# Patient Record
Sex: Female | Born: 1970
Health system: Southern US, Community
[De-identification: ages and names within clinical notes are randomized; demographics above are authoritative.]

## PROBLEM LIST (undated history)

## (undated) ENCOUNTER — Inpatient Hospital Stay: Payer: Self-pay

## (undated) DIAGNOSIS — R0600 Dyspnea, unspecified: Secondary | ICD-10-CM

## (undated) DIAGNOSIS — Z862 Personal history of diseases of the blood and blood-forming organs and certain disorders involving the immune mechanism: Secondary | ICD-10-CM

## (undated) DIAGNOSIS — R87629 Unspecified abnormal cytological findings in specimens from vagina: Secondary | ICD-10-CM

## (undated) DIAGNOSIS — O09299 Supervision of pregnancy with other poor reproductive or obstetric history, unspecified trimester: Secondary | ICD-10-CM

## (undated) DIAGNOSIS — R002 Palpitations: Secondary | ICD-10-CM

## (undated) DIAGNOSIS — M1990 Unspecified osteoarthritis, unspecified site: Secondary | ICD-10-CM

## (undated) HISTORY — DX: Supervision of pregnancy with other poor reproductive or obstetric history, unspecified trimester: O09.299

## (undated) HISTORY — PX: TONSILLECTOMY: SUR1361

## (undated) HISTORY — PX: WISDOM TOOTH EXTRACTION: SHX21

## (undated) HISTORY — DX: Palpitations: R00.2

## (undated) HISTORY — DX: Unspecified abnormal cytological findings in specimens from vagina: R87.629

## (undated) HISTORY — PX: LEEP: SHX91

---

## 2006-11-14 HISTORY — PX: CERVICAL CONE BIOPSY: SUR198

## 2011-05-30 ENCOUNTER — Encounter: Payer: Self-pay | Admitting: Unknown Physician Specialty

## 2011-06-15 ENCOUNTER — Encounter: Payer: Self-pay | Admitting: Unknown Physician Specialty

## 2015-11-15 NOTE — L&D Delivery Note (Signed)
Delivery Summary for Erin Gomez  Labor Events:   Preterm labor:   Rupture date:   Rupture time:   Rupture type: Spontaneous  Fluid Color: Clear  Induction:   Augmentation:   Complications:   Cervical ripening:          Delivery:   Episiotomy:   Lacerations:   Repair suture:   Repair # of packets:   Blood loss (ml): 400   Information for the patient's newborn:  [name], [name] [[id number]][[id number]]    Delivery 10/14/2016 11:41 AM by  Vaginal, Spontaneous Delivery Sex:  female Gestational Age: 4759w4d Delivery Clinician:   Living?:         APGARS  One minute Five minutes Ten minutes  Skin color:        Heart rate:        Grimace:        Muscle tone:        Breathing:        Totals: 8  9      Presentation/position:      Resuscitation:   Cord information:    Disposition of cord blood:     Blood gases sent?  Complications:   Placenta: Delivered:       appearance Newborn Measurements: Weight: 8 lb 12 oz (3970 g)  Height: 20.47"  Head circumference:    Chest circumference:    Other providers:    Additional  information: Forceps:   Vacuum:   Breech:   Observed anomalies       Delivery Note At 11:41 AM a viable and healthy female was delivered via Vaginal, Spontaneous Delivery (Presentation: Compound Vertex (with hand); LOA position).  APGAR: 8, 9; weight 8 lb 12 oz (3970 g).   Placenta status: spontaneously removed, intact.  Cord: 3-vessel, with the following complications: None.  Cord pH: not obtained.  Anesthesia: None Episiotomy:  None Lacerations:  Left vaginal, hemostatic Suture Repair: None Est. Blood Loss (mL):  400   Mom to postpartum.  Baby to Couplet care / Skin to Skin.  Hildred Lasernika Hector Taft 10/14/2016, 6:09 PM

## 2016-02-29 ENCOUNTER — Encounter: Payer: Self-pay | Admitting: Obstetrics and Gynecology

## 2016-02-29 ENCOUNTER — Ambulatory Visit: Payer: BLUE CROSS/BLUE SHIELD

## 2016-02-29 VITALS — BP 116/58 | HR 72 | Wt 140.7 lb

## 2016-02-29 DIAGNOSIS — N926 Irregular menstruation, unspecified: Secondary | ICD-10-CM

## 2016-03-01 ENCOUNTER — Other Ambulatory Visit: Payer: Self-pay | Admitting: Obstetrics and Gynecology

## 2016-03-01 DIAGNOSIS — O360190 Maternal care for anti-D [Rh] antibodies, unspecified trimester, not applicable or unspecified: Secondary | ICD-10-CM

## 2016-03-01 DIAGNOSIS — Z6791 Unspecified blood type, Rh negative: Secondary | ICD-10-CM | POA: Insufficient documentation

## 2016-03-01 DIAGNOSIS — O26899 Other specified pregnancy related conditions, unspecified trimester: Secondary | ICD-10-CM | POA: Insufficient documentation

## 2016-03-01 LAB — CBC WITH DIFFERENTIAL/PLATELET
BASOS: 0 %
Basophils Absolute: 0 10*3/uL (ref 0.0–0.2)
EOS (ABSOLUTE): 0.1 10*3/uL (ref 0.0–0.4)
EOS: 1 %
Hematocrit: 38.5 % (ref 34.0–46.6)
Hemoglobin: 12.8 g/dL (ref 11.1–15.9)
IMMATURE GRANS (ABS): 0 10*3/uL (ref 0.0–0.1)
IMMATURE GRANULOCYTES: 0 %
LYMPHS: 14 %
Lymphocytes Absolute: 1.1 10*3/uL (ref 0.7–3.1)
MCH: 30.1 pg (ref 26.6–33.0)
MCHC: 33.2 g/dL (ref 31.5–35.7)
MCV: 91 fL (ref 79–97)
MONOS ABS: 0.4 10*3/uL (ref 0.1–0.9)
Monocytes: 5 %
NEUTROS PCT: 80 %
Neutrophils Absolute: 6.3 10*3/uL (ref 1.4–7.0)
PLATELETS: 211 10*3/uL (ref 150–379)
RBC: 4.25 x10E6/uL (ref 3.77–5.28)
RDW: 12.8 % (ref 12.3–15.4)
WBC: 7.9 10*3/uL (ref 3.4–10.8)

## 2016-03-01 LAB — URINALYSIS, ROUTINE W REFLEX MICROSCOPIC
Bilirubin, UA: NEGATIVE
Glucose, UA: NEGATIVE
Ketones, UA: NEGATIVE
Leukocytes, UA: NEGATIVE
NITRITE UA: NEGATIVE
Protein, UA: NEGATIVE
RBC, UA: NEGATIVE
SPEC GRAV UA: 1.007 (ref 1.005–1.030)
UUROB: 0.2 mg/dL (ref 0.2–1.0)
pH, UA: 7 (ref 5.0–7.5)

## 2016-03-01 LAB — GC/CHLAMYDIA PROBE AMP
Chlamydia trachomatis, NAA: NEGATIVE
NEISSERIA GONORRHOEAE BY PCR: NEGATIVE

## 2016-03-01 LAB — VARICELLA ZOSTER ANTIBODY, IGG: VARICELLA: 1119 {index} (ref >165)

## 2016-03-01 LAB — RUBELLA SCREEN: Rubella Antibodies, IGG: 2.73 index (ref >0.99)

## 2016-03-01 LAB — URINE CULTURE: ORGANISM ID, BACTERIA: NO GROWTH

## 2016-03-01 LAB — ABO AND RH: RH TYPE: NEGATIVE

## 2016-03-01 LAB — HEP, RPR, HIV PANEL
HEP B S AG: NEGATIVE
HIV Screen 4th Generation wRfx: NONREACTIVE
RPR Ser Ql: NONREACTIVE

## 2016-03-01 LAB — BETA HCG QUANT (REF LAB): hCG Quant: 142610 m[IU]/mL

## 2016-03-01 LAB — ANTIBODY SCREEN: Antibody Screen: NEGATIVE

## 2016-03-02 ENCOUNTER — Telehealth: Payer: Self-pay | Admitting: *Deleted

## 2016-03-02 NOTE — Telephone Encounter (Signed)
Patient called and stated that she was prescribed Zofran from another provider for nausea,.That provider  prescribed 8 mg every 8hrs. . The pharmacy stated that 8 mg was too much and that  it was ok to cut the dosage in 1/2 to 4 mg every 8hrs. Pt did that and noticed that it is wearing off sooner about 6hrs. Pt is wondering if she can take the other 4mg  even if it has not been 8hrs yet.  Patient is requesting a call back [phone]

## 2016-03-03 NOTE — Telephone Encounter (Signed)
I spoke with MNS and she did confirm that pt could have Zofran 8mg  every 6hrs if needed. When I spoke with pt she said the 8mg  made her woozy, and the 4mg  was only lasting about 6 hrs. Pt informed she could take the 4mg  every 6h as needed.

## 2016-03-04 ENCOUNTER — Other Ambulatory Visit: Payer: BLUE CROSS/BLUE SHIELD

## 2016-03-04 ENCOUNTER — Ambulatory Visit (INDEPENDENT_AMBULATORY_CARE_PROVIDER_SITE_OTHER): Payer: BLUE CROSS/BLUE SHIELD

## 2016-03-04 ENCOUNTER — Telehealth: Payer: Self-pay | Admitting: *Deleted

## 2016-03-04 DIAGNOSIS — N926 Irregular menstruation, unspecified: Secondary | ICD-10-CM | POA: Diagnosis not present

## 2016-03-04 DIAGNOSIS — Z8759 Personal history of other complications of pregnancy, childbirth and the puerperium: Secondary | ICD-10-CM

## 2016-03-04 LAB — PROGESTERONE: PROGESTERONE: 31.2 ng/mL

## 2016-03-04 NOTE — Telephone Encounter (Signed)
-----   Message from Purcell NailsMelody N Shambley, [place] sent at 03/04/2016  2:09 PM EDT ----- Please let her know progesterone levels are good!

## 2016-03-04 NOTE — Telephone Encounter (Signed)
Left pt detailed message about labs 

## 2016-03-14 NOTE — Progress Notes (Unsigned)
NO[facility] nurse intake discussed all info in packet with pt, she has strong history of miscarriages, will follow closely, patient and husband voiced understanding

## 2016-03-21 ENCOUNTER — Encounter: Payer: Self-pay | Admitting: Obstetrics and Gynecology

## 2016-03-23 ENCOUNTER — Ambulatory Visit (INDEPENDENT_AMBULATORY_CARE_PROVIDER_SITE_OTHER): Payer: BLUE CROSS/BLUE SHIELD | Admitting: Obstetrics and Gynecology

## 2016-03-23 ENCOUNTER — Encounter: Payer: Self-pay | Admitting: Obstetrics and Gynecology

## 2016-03-23 VITALS — BP 111/66 | HR 78 | Ht 64.0 in | Wt 140.9 lb

## 2016-03-23 DIAGNOSIS — Z331 Pregnant state, incidental: Secondary | ICD-10-CM

## 2016-03-23 LAB — POCT URINALYSIS DIPSTICK
Bilirubin, UA: NEGATIVE
GLUCOSE UA: NEGATIVE
KETONES UA: NEGATIVE
Leukocytes, UA: NEGATIVE
Nitrite, UA: NEGATIVE
Protein, UA: NEGATIVE
RBC UA: NEGATIVE
UROBILINOGEN UA: 0.2
pH, UA: 6

## 2016-03-23 NOTE — Patient Instructions (Signed)
Rh Incompatibility Rh incompatibility is a condition that occurs during pregnancy if a woman has Rh-negative blood and her baby has Rh-positive blood. "Rh-negative" and "Rh-positive" refer to whether or not the blood has an Rh factor. An Rh factor is a specific protein found on the surface of red blood cells. If a woman has Rh factor, she is Rh-positive. If she does not have an Rh factor, she is Rh-negative. Having or not having an Rh factor does not affect the mother's general health. However, it can cause problems during pregnancy.  WHAT KIND OF PROBLEMS CAN Rh INCOMPATIBILITY CAUSE? During pregnancy, blood from the baby can cross into the mother's bloodstream, especially during delivery. If a mother is Rh-negative and the baby is Rh-positive, the mother's defense system will react to the baby's blood as if it was a foreign substance and will create proteins (antibodies). This is called sensitization. Once the mother is sensitized, her Rh antibodies will cross the placenta to the baby and attack the baby's Rh-positive blood as if it is a harmful substance.  Rh incompatibility can also happen if the Rh-negative pregnant woman is exposed to the Rh factor during a blood transfusion with Rh-positive blood.  HOW DOES THIS CONDITION AFFECT MY BABY? The Rh antibodies that attack and destroy the baby's red blood cells can lead to hemolytic disease in the baby. Hemolytic disease is when the red blood cells break down. This can cause:   Yellowing of the skin and eyes (jaundice).  The body to not have enough healthy red blood cells (anemia).   Brain damage.   Heart failure.   Death.  These antibodies usually do not cause problems during a first pregnancy. This is because the blood from the baby often times crosses into the mother's bloodstream during delivery, and the baby is born before many of the antibodies can develop. However, the antibodies stay in your body once they have formed. Because of this,  Rh incompatibility is more likely to cause problems in second or later pregnancies (if the baby is Rh-positive).  HOW IS THIS CONDITION DIAGNOSED? When a woman becomes pregnant, blood tests may be done to find out her blood type and Rh factor. If the woman is Rh-negative, she also may have another blood test called an antibody screen. The antibody screen shows whether she has Rh antibodies in her blood. If she does, it means she was exposed to Rh-positive blood before, and she is at risk for Rh incompatibility.  To find out whether the baby is developing hemolytic anemia and how serious it is, caregivers may use more advanced tests, such as ultrasonography (commonly known as ultrasound).  HOW IS Rh INCOMPATIBILITY TREATED?  Rh incompatibility is treated with a shot of medicine called Rho (D) immune globulin. This medicine keeps the woman's body from making antibodies that can cause serious problems in the baby or future babies.  Two shots will be given, one at around your seventh month of pregnancy and the other within 72 hours of your baby being born. If you are Rh-negative, you will need this medicine every time you have a baby with Rh-positive blood. If you already have antibodies in your blood, Rho (D) immune globulin will not help. Your doctor will not give you this medicine, but will watch your pregnancy closely for problems instead.  This shot may also be given to an Rh-negative woman when the risk of blood transfer between the mom and baby is high. The risk is high with:     An amniocentesis.   A miscarriage or an abortion.   An ectopic pregnancy.   Any vaginal bleeding during pregnancy.    This information is not intended to replace advice given to you by your health care provider. Make sure you discuss any questions you have with your health care provider.   Document Released: 04/22/2002 Document Revised: 11/05/2013 Document Reviewed: 02/12/2013 Elsevier Interactive Patient  Education 2016 Elsevier Inc.  

## 2016-03-23 NOTE — Progress Notes (Signed)
ROB- pt is worried, wants to have some reassurance, taking her zofran

## 2016-03-23 NOTE — Progress Notes (Signed)
  NEW OB HISTORY AND PHYSICAL  SUBJECTIVE:       Erin Gomez is a 45 y.o. [id number] female, Patient's last menstrual period was 01/04/2016., Estimated Date of Delivery: 10/10/16, 2576w2d, presents today for establishment of Prenatal Care. She has no unusual complaints and complains of nausea and vomiting relieved with zofran      Gynecologic History Patient's last menstrual period was 01/04/2016. Normal Contraception: none Last Pap: 2017. Results were: normal  Obstetric History OB History  Gravida Para Term Preterm AB SAB TAB Ectopic Multiple Living  9    8 8         # Outcome Date GA Lbr Len/2nd Weight Sex Delivery Anes PTL Lv  9 Current           8 SAB           7 SAB           6 SAB           5 SAB           4 SAB           3 SAB           2 SAB           1 SAB               Past Medical History  Diagnosis Date  . History of miscarriage, currently pregnant   . Vaginal Pap smear, abnormal     Past Surgical History  Procedure Laterality Date  . Leep      Current Outpatient Prescriptions on File Prior to Visit  Medication Sig Dispense Refill  . Prenatal Vit-Fe Fumarate-FA (PRENATAL MULTIVITAMIN) TABS tablet Take 1 tablet by mouth daily at 12 noon.     No current facility-administered medications on file prior to visit.    No Known Allergies  Social History   Social History  . Marital Status: Married    Spouse Name: N/A  . Number of Children: N/A  . Years of Education: N/A   Occupational History  . Not on file.   Social History Main Topics  . Smoking status: Never Smoker   . Smokeless tobacco: Never Used  . Alcohol Use: No  . Drug Use: No  . Sexual Activity: Yes   Other Topics Concern  . Not on file   Social History Narrative    History reviewed. No pertinent family history.  The following portions of the patient's history were reviewed and updated as appropriate: allergies, current medications, past OB history, past medical history,  past surgical history, past family history, past social history, and problem list.    OBJECTIVE: Initial Physical Exam (New OB)  GENERAL APPEARANCE: alert, well appearing, in no apparent distress, oriented to person, place and time, anxious HEAD: normocephalic, atraumatic MOUTH: mucous membranes moist, pharynx normal without lesions and dental hygiene good THYROID: no thyromegaly or masses present BREASTS: not examined LUNGS: clear to auscultation, no wheezes, rales or rhonchi, symmetric air entry HEART: regular rate and rhythm, no murmurs ABDOMEN: soft, nontender, nondistended, no abnormal masses, no epigastric pain, fundus not palpable and FHT present EXTREMITIES: no edema SKIN: normal coloration and turgor, no rashes LYMPH NODES: no adenopathy palpable NEUROLOGIC: alert, oriented, normal speech, no focal findings or movement disorder noted  PELVIC EXAM not indicated  ASSESSMENT: Normal pregnancy AMA Recurrent early miscarriages  PLAN: Prenatal care-routine Reviewed negative genetic screening See orders

## 2016-04-01 ENCOUNTER — Other Ambulatory Visit: Payer: Self-pay | Admitting: *Deleted

## 2016-04-01 ENCOUNTER — Telehealth: Payer: Self-pay | Admitting: Obstetrics and Gynecology

## 2016-04-01 MED ORDER — ACYCLOVIR 400 MG PO TABS: 400.0000 mg | ORAL_TABLET | Freq: Two times a day (BID) | ORAL | Status: DC

## 2016-04-01 NOTE — Telephone Encounter (Signed)
Done-ac 

## 2016-04-01 NOTE — Telephone Encounter (Signed)
PT WANTS TO ASK YOU ABOUT A MEDICATION SHE USED TO TAKE AND MAY WANT TO TAKE IT AGAIN.

## 2016-04-20 ENCOUNTER — Ambulatory Visit (INDEPENDENT_AMBULATORY_CARE_PROVIDER_SITE_OTHER): Payer: BLUE CROSS/BLUE SHIELD | Admitting: Obstetrics and Gynecology

## 2016-04-20 ENCOUNTER — Encounter: Payer: Self-pay | Admitting: Obstetrics and Gynecology

## 2016-04-20 VITALS — BP 105/62 | HR 80 | Wt 143.6 lb

## 2016-04-20 DIAGNOSIS — Z369 Encounter for antenatal screening, unspecified: Secondary | ICD-10-CM

## 2016-04-20 DIAGNOSIS — O09519 Supervision of elderly primigravida, unspecified trimester: Secondary | ICD-10-CM | POA: Insufficient documentation

## 2016-04-20 DIAGNOSIS — Z1389 Encounter for screening for other disorder: Secondary | ICD-10-CM

## 2016-04-20 DIAGNOSIS — A609 Anogenital herpesviral infection, unspecified: Secondary | ICD-10-CM

## 2016-04-20 DIAGNOSIS — Z36 Encounter for antenatal screening of mother: Secondary | ICD-10-CM

## 2016-04-20 DIAGNOSIS — A6009 Herpesviral infection of other urogenital tract: Secondary | ICD-10-CM | POA: Insufficient documentation

## 2016-04-20 LAB — POCT URINALYSIS DIPSTICK
Bilirubin, UA: NEGATIVE
Blood, UA: NEGATIVE
GLUCOSE UA: NEGATIVE
Ketones, UA: NEGATIVE
Leukocytes, UA: NEGATIVE
NITRITE UA: NEGATIVE
PH UA: 8
PROTEIN UA: NEGATIVE
SPEC GRAV UA: 1.01
UROBILINOGEN UA: NEGATIVE

## 2016-04-20 NOTE — Progress Notes (Signed)
Rob-DOING BETTER, ONLY OCCASIONAL NAUSEA.had herpes outbreak last week, took meds and it went away- will start prophylaxis at 34 weeks.

## 2016-05-13 ENCOUNTER — Telehealth: Payer: Self-pay | Admitting: *Deleted

## 2016-05-13 NOTE — Telephone Encounter (Signed)
Nothing to help sore throat but losenges and cloraseptic spray (all OTC and safe in pregnancy), and if she thinks she is having a sinus infection, she will need to be examined- recommend [facility] in leu of holiday weekend

## 2016-05-13 NOTE — Telephone Encounter (Signed)
Patient called and states she has to sing tonight was wondering if the Melody can prescribe her something to take for a sore throat. She is also experiencing sinus infection symptoms and a cough. Pt [facility] is [facility] [place]. Thanks

## 2016-05-13 NOTE — Telephone Encounter (Signed)
pls advise

## 2016-05-16 ENCOUNTER — Telehealth: Payer: Self-pay | Admitting: Obstetrics and Gynecology

## 2016-05-16 ENCOUNTER — Telehealth: Payer: Self-pay | Admitting: Family

## 2016-05-16 DIAGNOSIS — B9689 Other specified bacterial agents as the cause of diseases classified elsewhere: Principal | ICD-10-CM

## 2016-05-16 DIAGNOSIS — J988 Other specified respiratory disorders: Secondary | ICD-10-CM

## 2016-05-16 MED ORDER — AZITHROMYCIN 250 MG PO TABS: ORAL_TABLET | ORAL | Status: DC

## 2016-05-16 NOTE — Progress Notes (Signed)
We are sorry that you are not feeling well.  Here is how we plan to help!  Based on what you have shared with me it looks like you have upper respiratory tract inflammation that has resulted in a significant cough.  Inflammation and infection in the upper respiratory tract is commonly called bronchitis and has four common causes:  Allergies, Viral Infections, Acid Reflux and Bacterial Infections.  Allergies, viruses and acid reflux are treated by controlling symptoms or eliminating the cause. An example might be a cough caused by taking certain blood pressure medications. You stop the cough by changing the medication. Another example might be a cough caused by acid reflux. Controlling the reflux helps control the cough.  Based on your presentation I believe you most likely have A cough due to bacteria.  When patients have a fever and a productive cough with a change in color or increased sputum production, we are concerned about bacterial bronchitis.  If left untreated it can progress to pneumonia.  If your symptoms do not improve with your treatment plan it is important that you contact your provider.   I have prescribed Azithromyin 250 mg: two tables now and then one tablet daily for 4 additonal days   In addition you may use Mucinex but NOT the DM version.    HOME CARE . Only take medications as instructed by your medical team. . Complete the entire course of an antibiotic. . Drink plenty of fluids and get plenty of rest. . Avoid close contacts especially the very young and the elderly . Cover your mouth if you cough or cough into your sleeve. . Always remember to wash your hands . A steam or ultrasonic humidifier can help congestion.    GET HELP RIGHT AWAY IF: . You develop worsening fever. . You become short of breath . You cough up blood. . Your symptoms persist after you have completed your treatment plan MAKE SURE YOU   Understand these instructions.  Will watch your  condition.  Will get help right away if you are not doing well or get worse.  Your e-visit answers were reviewed by a board certified advanced clinical practitioner to complete your personal care plan.  Depending on the condition, your plan could have included both over the counter or prescription medications. If there is a problem please reply  once you have received a response from your provider. Your safety is important to us.  If you have drug allergies check your prescription carefully.    You can use MyChart to ask questions about today's visit, request a non-urgent call back, or ask for a work or school excuse for 24 hours related to this e-Visit. If it has been greater than 24 hours you will need to follow up with your provider, or enter a new e-Visit to address those concerns. You will get an e-mail in the next two days asking about your experience.  I hope that your e-visit has been valuable and will speed your recovery. Thank you for using e-visits.

## 2016-05-16 NOTE — Telephone Encounter (Signed)
[redacted] WKS PREGNANT// SINUS INF, EAR ACHE  THROAT SORE, NO FEVER./ MUCUS GREEN., COUGH THRU THE NITE  PT WANTS TO KNOW IF SHE NEEDS AN ANTIBIOTIC ([facility]

## 2016-05-16 NOTE — Telephone Encounter (Signed)
Left detailed message needs to be seen

## 2016-05-18 ENCOUNTER — Encounter: Payer: Self-pay | Admitting: Obstetrics and Gynecology

## 2016-05-18 ENCOUNTER — Ambulatory Visit (INDEPENDENT_AMBULATORY_CARE_PROVIDER_SITE_OTHER): Payer: BLUE CROSS/BLUE SHIELD

## 2016-05-18 ENCOUNTER — Ambulatory Visit (INDEPENDENT_AMBULATORY_CARE_PROVIDER_SITE_OTHER): Payer: BLUE CROSS/BLUE SHIELD | Admitting: Obstetrics and Gynecology

## 2016-05-18 VITALS — BP 120/72 | HR 88 | Wt 143.0 lb

## 2016-05-18 DIAGNOSIS — J988 Other specified respiratory disorders: Secondary | ICD-10-CM

## 2016-05-18 DIAGNOSIS — Z3492 Encounter for supervision of normal pregnancy, unspecified, second trimester: Secondary | ICD-10-CM

## 2016-05-18 DIAGNOSIS — B9689 Other specified bacterial agents as the cause of diseases classified elsewhere: Secondary | ICD-10-CM

## 2016-05-18 DIAGNOSIS — Z369 Encounter for antenatal screening, unspecified: Secondary | ICD-10-CM

## 2016-05-18 DIAGNOSIS — Z36 Encounter for antenatal screening of mother: Secondary | ICD-10-CM

## 2016-05-18 DIAGNOSIS — J069 Acute upper respiratory infection, unspecified: Secondary | ICD-10-CM

## 2016-05-18 LAB — POCT URINALYSIS DIPSTICK
Bilirubin, UA: NEGATIVE
Blood, UA: NEGATIVE
Glucose, UA: NEGATIVE
KETONES UA: NEGATIVE
LEUKOCYTES UA: NEGATIVE
Nitrite, UA: NEGATIVE
PH UA: 6
PROTEIN UA: NEGATIVE
Spec Grav, UA: 1.01
Urobilinogen, UA: 0.2

## 2016-05-18 MED ORDER — AZITHROMYCIN 250 MG PO TABS: ORAL_TABLET | ORAL | Status: DC

## 2016-05-18 MED ORDER — CEFDINIR 300 MG PO CAPS: 300.0000 mg | ORAL_CAPSULE | Freq: Two times a day (BID) | ORAL | Status: DC

## 2016-05-18 MED ORDER — FOLIC ACID 1 MG PO TABS: 1.0000 mg | ORAL_TABLET | Freq: Every day | ORAL | Status: DC

## 2016-05-18 NOTE — Progress Notes (Signed)
ROB- pt had anatomy scan today- doesn't want to know the sex of the baby, she has sinus inf- was given zpak- doesn't feel like its working, had pain B ears

## 2016-05-18 NOTE — Progress Notes (Signed)
ROB- anatomy scan reviewed: Indications:Anatomy U/S Findings:  Singleton intrauterine pregnancy is visualized with FHR at 153 BPM. Biometrics give an (U/S) Gestational age of 19 2/7 weeks and an (U/S) EDD of 10/10/16; this correlates with the clinically established EDD of 10/10/16.  Fetal presentation is Breech.  EFW: 283g (10oz). Placenta: Anterior, grade 0, 4.3cm from internal os. AFI: Adequate with MVP of 3.4cm.  Anatomic survey is incomplete and normal; Gender - female  .  Spine is suboptimally visualized due to fetal position.  Ovaries are not seen. Survey of the adnexa demonstrates no adnexal masses. There is no free peritoneal fluid in the cul de sac.  Impression: 1. 19 2/7 week Viable Singleton Intrauterine pregnancy by U/S. 2. (U/S) EDD is consistent with Clinically established (LMP) EDD of 10/10/16. 3. Spine is suboptimally visualized due to fetal position.  URI not improving with prescribed Zpak- switched to omnecef 300mg  bid; to add sudafed prn and restart acyclovir daily.

## 2016-06-10 ENCOUNTER — Other Ambulatory Visit: Payer: Self-pay | Admitting: Obstetrics and Gynecology

## 2016-06-10 DIAGNOSIS — Z0489 Encounter for examination and observation for other specified reasons: Secondary | ICD-10-CM

## 2016-06-10 DIAGNOSIS — IMO0002 Reserved for concepts with insufficient information to code with codable children: Secondary | ICD-10-CM

## 2016-06-15 ENCOUNTER — Ambulatory Visit (INDEPENDENT_AMBULATORY_CARE_PROVIDER_SITE_OTHER): Payer: BLUE CROSS/BLUE SHIELD

## 2016-06-15 ENCOUNTER — Ambulatory Visit (INDEPENDENT_AMBULATORY_CARE_PROVIDER_SITE_OTHER): Payer: BLUE CROSS/BLUE SHIELD | Admitting: Obstetrics and Gynecology

## 2016-06-15 VITALS — BP 111/77 | HR 71 | Wt 148.5 lb

## 2016-06-15 DIAGNOSIS — Z36 Encounter for antenatal screening of mother: Secondary | ICD-10-CM

## 2016-06-15 DIAGNOSIS — Z3492 Encounter for supervision of normal pregnancy, unspecified, second trimester: Secondary | ICD-10-CM

## 2016-06-15 DIAGNOSIS — Z0489 Encounter for examination and observation for other specified reasons: Secondary | ICD-10-CM

## 2016-06-15 DIAGNOSIS — IMO0002 Reserved for concepts with insufficient information to code with codable children: Secondary | ICD-10-CM

## 2016-06-15 LAB — POCT URINALYSIS DIPSTICK
Bilirubin, UA: NEGATIVE
Blood, UA: NEGATIVE
Glucose, UA: NEGATIVE
Ketones, UA: NEGATIVE
LEUKOCYTES UA: NEGATIVE
Nitrite, UA: NEGATIVE
PROTEIN UA: NEGATIVE
Spec Grav, UA: 1.01
Urobilinogen, UA: 0.2
pH, UA: 6.5

## 2016-06-15 NOTE — Progress Notes (Signed)
ROB- pt c/o pain under her rib cage, thinks she may have a hernia," otherwise she is bouncing around"

## 2016-06-15 NOTE — Progress Notes (Signed)
ROB-needs refill on zofran, doing well glucola next visit.

## 2016-07-05 ENCOUNTER — Other Ambulatory Visit: Payer: Self-pay | Admitting: *Deleted

## 2016-07-05 ENCOUNTER — Telehealth: Payer: Self-pay | Admitting: *Deleted

## 2016-07-05 MED ORDER — ONDANSETRON HCL 8 MG PO TABS: 8.0000 mg | ORAL_TABLET | Freq: Three times a day (TID) | ORAL | 3 refills | Status: DC | PRN

## 2016-07-05 NOTE — Telephone Encounter (Signed)
zofran was not at phrmacy and please ask for oval ones...  She is going to have Rhogam next visit and plans to travel. Will she have any symptoms from this inj

## 2016-07-05 NOTE — Telephone Encounter (Signed)
Sent medication in and advised of traveling

## 2016-07-11 ENCOUNTER — Other Ambulatory Visit: Payer: Self-pay | Admitting: *Deleted

## 2016-07-11 DIAGNOSIS — Z131 Encounter for screening for diabetes mellitus: Secondary | ICD-10-CM

## 2016-07-12 ENCOUNTER — Emergency Department: Payer: BLUE CROSS/BLUE SHIELD

## 2016-07-12 ENCOUNTER — Encounter: Payer: Self-pay | Admitting: Emergency Medicine

## 2016-07-12 ENCOUNTER — Emergency Department
Admission: EM | Admit: 2016-07-12 | Discharge: 2016-07-12 | Disposition: A | Discharge status: Home / Self Care | Payer: BLUE CROSS/BLUE SHIELD | Attending: Student | Admitting: Student

## 2016-07-12 DIAGNOSIS — Z5181 Encounter for therapeutic drug level monitoring: Secondary | ICD-10-CM | POA: Diagnosis not present

## 2016-07-12 DIAGNOSIS — G43809 Other migraine, not intractable, without status migrainosus: Secondary | ICD-10-CM | POA: Insufficient documentation

## 2016-07-12 DIAGNOSIS — O99352 Diseases of the nervous system complicating pregnancy, second trimester: Secondary | ICD-10-CM | POA: Diagnosis not present

## 2016-07-12 DIAGNOSIS — G43109 Migraine with aura, not intractable, without status migrainosus: Secondary | ICD-10-CM

## 2016-07-12 DIAGNOSIS — G43909 Migraine, unspecified, not intractable, without status migrainosus: Secondary | ICD-10-CM | POA: Diagnosis not present

## 2016-07-12 DIAGNOSIS — Z3A27 27 weeks gestation of pregnancy: Secondary | ICD-10-CM | POA: Diagnosis not present

## 2016-07-12 LAB — URINALYSIS COMPLETE WITH MICROSCOPIC (ARMC ONLY)
BILIRUBIN URINE: NEGATIVE
Glucose, UA: NEGATIVE mg/dL
HGB URINE DIPSTICK: NEGATIVE
KETONES UR: NEGATIVE mg/dL
LEUKOCYTES UA: NEGATIVE
NITRITE: NEGATIVE
PH: 7 (ref 5.0–8.0)
Protein, ur: NEGATIVE mg/dL
SPECIFIC GRAVITY, URINE: 1.003 — AB (ref 1.005–1.030)

## 2016-07-12 LAB — COMPREHENSIVE METABOLIC PANEL
ALBUMIN: 3.5 g/dL (ref 3.5–5.0)
ALT: 25 U/L (ref 14–54)
ANION GAP: 10 (ref 5–15)
AST: 23 U/L (ref 15–41)
Alkaline Phosphatase: 46 U/L (ref 38–126)
BUN: 7 mg/dL (ref 6–20)
CHLORIDE: 106 mmol/L (ref 101–111)
CO2: 19 mmol/L — AB (ref 22–32)
Calcium: 8.8 mg/dL — ABNORMAL LOW (ref 8.9–10.3)
Creatinine, Ser: 0.43 mg/dL — ABNORMAL LOW (ref 0.44–1.00)
GFR calc non Af Amer: >60 mL/min (ref >60)
GLUCOSE: 122 mg/dL — AB (ref 65–99)
Potassium: 3.7 mmol/L (ref 3.5–5.1)
SODIUM: 135 mmol/L (ref 135–145)
Total Bilirubin: 0.3 mg/dL (ref 0.3–1.2)
Total Protein: 6.4 g/dL — ABNORMAL LOW (ref 6.5–8.1)

## 2016-07-12 LAB — CBC WITH DIFFERENTIAL/PLATELET
BASOS PCT: 0 %
Basophils Absolute: 0 10*3/uL (ref 0–0.1)
EOS ABS: 0.1 10*3/uL (ref 0–0.7)
EOS PCT: 1 %
HCT: 35.4 % (ref 35.0–47.0)
Hemoglobin: 12.5 g/dL (ref 12.0–16.0)
LYMPHS ABS: 1 10*3/uL (ref 1.0–3.6)
Lymphocytes Relative: 13 %
MCH: 31 pg (ref 26.0–34.0)
MCHC: 35.4 g/dL (ref 32.0–36.0)
MCV: 87.7 fL (ref 80.0–100.0)
Monocytes Absolute: 0.4 10*3/uL (ref 0.2–0.9)
Monocytes Relative: 6 %
NEUTROS PCT: 80 %
Neutro Abs: 6.5 10*3/uL (ref 1.4–6.5)
PLATELETS: 170 10*3/uL (ref 150–440)
RBC: 4.04 MIL/uL (ref 3.80–5.20)
RDW: 13.2 % (ref 11.5–14.5)
WBC: 8.1 10*3/uL (ref 3.6–11.0)

## 2016-07-12 LAB — PROTIME-INR
INR: 0.9
Prothrombin Time: 12.1 seconds (ref 11.4–15.2)

## 2016-07-12 LAB — ETHANOL

## 2016-07-12 LAB — APTT: APTT: 26 s (ref 24–36)

## 2016-07-12 MED ORDER — ACETAMINOPHEN 500 MG PO TABS
1000.0000 mg | ORAL_TABLET | Freq: Once | ORAL | Status: AC
  Administered 2016-07-12: 1000 mg via ORAL
  Filled 2016-07-12: qty 2

## 2016-07-12 NOTE — Discharge Instructions (Signed)
You were seen in the emergency department for numbness in your hand, speech periumbilical to in vision change as well as headache which are thought to be due to a complicated migraine. Follow up with your OB/GYN doctor and Dr. Sherryll BurgerShah of neurology as soon as possible. Return immediately to the emergency department if you develop recurrence of vision change, numbness or weakness, speech difficulty, severe or worsening headache, vomiting, confusion, chest pain, difficulty breathing, or for any other concerns.

## 2016-07-12 NOTE — Consult Note (Signed)
Referring Physician: Inocencio Homes    Chief Complaint: Headache, difficulty with speech and right arm numbness  HPI: Erin Gomez is an 45 y.o. female who is [redacted] weeks pregnant who reports after awakening this morning having a sensation of kaleidoscope vision.  Patient then had intercourse with after orgasm had onset of headache , difficulty with speech lasting only a few minutes and right arm weakness into the first two fingers. Overall symptoms lasted about 45 minutes and are currently all resolved other than her headache.  Rates headache at 7/10.  It is occipital and on the left.  Initial NIHSS of 0.     Date last known well: Date: 07/12/2016 Time last known well: Time: 07:15 tPA Given: No: Symptoms resolved  Past Medical History:  Diagnosis Date  . History of miscarriage, currently pregnant   . Vaginal Pap smear, abnormal     Past Surgical History:  Procedure Laterality Date  . LEEP      Family history: Both parents alive and well with no medical problems  Social History:  reports that she has never smoked. She has never used smokeless tobacco. She reports that she does not drink alcohol or use drugs.  Allergies: No Known Allergies  Medications: I have reviewed the patient's current medications. Prior to Admission:  Prior to Admission medications   Medication Sig Start Date End Date Taking? Authorizing Provider  acyclovir (ZOVIRAX) 400 MG tablet Take 1 tablet (400 mg total) by mouth 2 (two) times daily. Patient not taking: Reported on 04/20/2016 04/01/16   [name], CNM  folic acid (FOLVITE) 1 MG tablet Take 1 tablet (1 mg total) by mouth daily. 05/18/16   [name], CNM  ondansetron (ZOFRAN) 8 MG tablet Take 1 tablet (8 mg total) by mouth every 8 (eight) hours as needed for nausea or vomiting. Pt would like oval shaped pill 07/05/16   Melody Suzan Nailer, CNM  Prenatal Vit-Fe Fumarate-FA (PRENATAL MULTIVITAMIN) TABS tablet Take 1 tablet by mouth daily at 12 noon.     Historical Provider, MD     ROS: History obtained from the patient  General ROS: negative for - chills, fatigue, fever, night sweats, weight gain or weight loss Psychological ROS: negative for - behavioral disorder, hallucinations, memory difficulties, mood swings or suicidal ideation Ophthalmic ROS: negative for - blurry vision, double vision, eye pain or loss of vision ENT ROS: negative for - epistaxis, nasal discharge, oral lesions, sore throat, tinnitus or vertigo Allergy and Immunology ROS: negative for - hives or itchy/watery eyes Hematological and Lymphatic ROS: negative for - bleeding problems, bruising or swollen lymph nodes Endocrine ROS: negative for - galactorrhea, hair pattern changes, polydipsia/polyuria or temperature intolerance Respiratory ROS: negative for - cough, hemoptysis, shortness of breath or wheezing Cardiovascular ROS: negative for - chest pain, dyspnea on exertion, edema or irregular heartbeat Gastrointestinal ROS: negative for - abdominal pain, diarrhea, hematemesis, nausea/vomiting or stool incontinence Genito-Urinary ROS: negative for - dysuria, hematuria, incontinence or urinary frequency/urgency Musculoskeletal ROS: negative for - joint swelling or muscular weakness Neurological ROS: as noted in HPI Dermatological ROS: negative for rash and skin lesion changes  Physical Examination: Blood pressure 130/78, pulse (!) 102, temperature 98.1 F (36.7 C), resp. rate 16, last menstrual period 01/04/2016, SpO2 99 %.  HEENT-  Normocephalic, no lesions, without obvious abnormality.  Normal external eye and conjunctiva.  Normal TM's bilaterally.  Normal auditory canals and external ears. Normal external nose, mucus membranes and septum.  Normal pharynx. Cardiovascular- S1, S2  normal, pulses palpable throughout   Lungs- chest clear, no wheezing, rales, normal symmetric air entry Abdomen- soft, non-tender; bowel sounds normal; no masses,  no organomegaly Extremities-  no edema Lymph-no adenopathy palpable Musculoskeletal-no joint tenderness, deformity or swelling Skin-warm and dry, no hyperpigmentation, vitiligo, or suspicious lesions  Neurological Examination Mental Status: Alert, oriented, thought content appropriate.  Speech fluent without evidence of aphasia.  Able to follow 3 step commands without difficulty. Cranial Nerves: II: Discs flat bilaterally; Visual fields grossly normal, pupils equal, round, reactive to light and accommodation III,IV, VI: ptosis not present, extra-ocular motions intact bilaterally V,VII: smile symmetric, facial light touch sensation normal bilaterally VIII: hearing normal bilaterally IX,X: gag reflex present XI: bilateral shoulder shrug XII: midline tongue extension Motor: Right : Upper extremity   5/5    Left:     Upper extremity   5/5  Lower extremity   5/5     Lower extremity   5/5 Tone and bulk:normal tone throughout; no atrophy noted Sensory: Pinprick and light touch intact throughout, bilaterally Deep Tendon Reflexes: 2+ and symmetric throughout Plantars: Right: downgoing   Left: downgoing Cerebellar: Normal finger-to-nose and normal heel-to-shin test Gait: not tested due to safety concerns   Laboratory Studies:  Basic Metabolic Panel:  Recent Labs Lab 07/12/16 0920  NA 135  K 3.7  CL 106  CO2 19*  GLUCOSE 122*  BUN 7  CREATININE 0.43*  CALCIUM 8.8*    Liver Function Tests:  Recent Labs Lab 07/12/16 0920  AST 23  ALT 25  ALKPHOS 46  BILITOT 0.3  PROT 6.4*  ALBUMIN 3.5   No results for input(s): LIPASE, AMYLASE in the last 168 hours. No results for input(s): AMMONIA in the last 168 hours.  CBC:  Recent Labs Lab 07/12/16 0920  WBC 8.1  NEUTROABS 6.5  HGB 12.5  HCT 35.4  MCV 87.7  PLT 170    Cardiac Enzymes: No results for input(s): CKTOTAL, CKMB, CKMBINDEX, TROPONINI in the last 168 hours.  BNP: Invalid input(s): POCBNP  CBG: No results for input(s): GLUCAP in the  last 168 hours.  Microbiology: Results for orders placed or performed in visit on 02/29/16  GC/Chlamydia Probe Amp     Status: None   Collection Time: 02/29/16  3:00 PM  Result Value Ref Range Status   Chlamydia trachomatis, NAA Negative Negative Final   Neisseria gonorrhoeae by PCR Negative Negative Final  Urine culture     Status: None   Collection Time: 02/29/16  3:00 PM  Result Value Ref Range Status   Urine Culture, Routine Final report  Final   Urine Culture result 1 No growth  Final    Coagulation Studies:  Recent Labs  07/12/16 0920  LABPROT 12.1  INR 0.90    Urinalysis:  Recent Labs Lab 07/12/16 0919  COLORURINE STRAW*  LABSPEC 1.003*  PHURINE 7.0  GLUCOSEU NEGATIVE  HGBUR NEGATIVE  BILIRUBINUR NEGATIVE  KETONESUR NEGATIVE  PROTEINUR NEGATIVE  NITRITE NEGATIVE  LEUKOCYTESUR NEGATIVE    Lipid Panel: No results found for: CHOL, TRIG, HDL, CHOLHDL, VLDL, LDLCALC  HgbA1C: No results found for: HGBA1C  Urine Drug Screen:  No results found for: LABOPIA, COCAINSCRNUR, LABBENZ, AMPHETMU, THCU, LABBARB  Alcohol Level:  Recent Labs Lab 07/12/16 0920  ETH <5    Other results: EKG: sinus tachycardia at 103 bpm.  Imaging: Ct Head Wo Contrast  Result Date: 07/12/2016 CLINICAL DATA:  Pt to ed with c/o headache, blurred vision, numbness in right hand and difficulty speaking  at 715 am today. Pt states she was driving down the road when the symptoms started. Pt reports all symptoms are resolved at this time. EXAM: CT HEAD WITHOUT CONTRAST TECHNIQUE: Contiguous axial images were obtained from the base of the skull through the vertex without intravenous contrast. COMPARISON:  None. FINDINGS: Brain: No acute intracranial hemorrhage. No focal mass lesion. No CT evidence of acute infarction. No midline shift or mass effect. No hydrocephalus. Basilar cisterns are patent. Vascular: Unremarkable Skull: Unremarkable Sinuses/Orbits: Paranasal sinuses and mastoid air cells are  clear. Orbits are clear. IMPRESSION: Normal head CT. Electronically Signed   By: Genevive Bi M.D.   On: 07/12/2016 09:34    Assessment: 45 y.o. female who is [redacted] weeks pregnant who presents with headache, difficulty with speech and right arm numbness.  Symptoms have all resolved other than headache.  Symptoms suggestive of a complicated migraine.  Head CT personally reviewed and shows no acute changes.  Has had previous similar episodes, one a few weeks ago and one about 10 years ago.  Both resolved without any residua.    Stroke Risk Factors - none  Plan: 1. Patient may initiate Magnesium since this may be helpful for her headaches.  Should be cleared by OB 2.  Analgesia for headache.   3.  Patient to follow up with neurology and her OB as an outpatient.     Thana Farr, MD Neurology [phone] 07/12/2016, 11:36 AM

## 2016-07-12 NOTE — ED Triage Notes (Addendum)
Pt to ed with c/o headache, blurred vision, numbness in right hand and difficulty speaking at 715 am today.  Pt states she was driving down the road when the symptoms started.  Pt reports all symptoms are resolved at this time except headache.  Pt is [redacted] weeks pregnant, md [facility].

## 2016-07-12 NOTE — ED Provider Notes (Addendum)
[facility] Emergency Department Provider Note   ____________________________________________   First MD Initiated Contact with Patient 07/12/16 [id number]     (approximate)  I have reviewed the triage vital signs and the nursing notes.   HISTORY  Chief Complaint Migraine    HPI Erin Gomez is a 45 y.o. female with no chronic medical problems, G1 P0 at 5927 weeks estimated gestational age followed by [facility] presents for evaluation of now resolved blurred vision, right hand numbness and speech difficulty that began at approximately 7 AM, gradual onset, moderate, no modifying factors. This was first noted while the patient was up and about, after eating breakfast. She reports that symptoms have resolved and she now is complaining of a headache. She reports that a similar episode happened to her several weeks ago, during the episode she had transient vision changes where she describes "kaleidoscoping" vision in both eyes, that resolved after which she developed a headache. She reports that this also happened approximately 10 years ago in a similar fashion and resolved though she never underwent evaluation. Currently she feels well. She denies any chest pain or difficulty breathing, no vomiting, diarrhea, fevers or chills. She continues to have good fetal movement, no loss of fluid from the vagina, no vaginal bleeding.   Past Medical History:  Diagnosis Date  . History of miscarriage, currently pregnant   . Vaginal Pap smear, abnormal     Patient Active Problem List   Diagnosis Date Noted  . URI, acute 05/18/2016  . AMA (advanced maternal age) primigravida 35+ 04/20/2016  . Herpes genitalis in women 04/20/2016  . Rh negative state in antepartum period 03/01/2016    Past Surgical History:  Procedure Laterality Date  . LEEP      Prior to Admission medications   Medication Sig Start Date End Date Taking? Authorizing Provider  acyclovir  (ZOVIRAX) 400 MG tablet Take 1 tablet (400 mg total) by mouth 2 (two) times daily. Patient not taking: Reported on 04/20/2016 04/01/16   [name], CNM  folic acid (FOLVITE) 1 MG tablet Take 1 tablet (1 mg total) by mouth daily. 05/18/16   [name], CNM  ondansetron (ZOFRAN) 8 MG tablet Take 1 tablet (8 mg total) by mouth every 8 (eight) hours as needed for nausea or vomiting. Pt would like oval shaped pill 07/05/16   Melody Suzan NailerN Shambley, CNM  Prenatal Vit-Fe Fumarate-FA (PRENATAL MULTIVITAMIN) TABS tablet Take 1 tablet by mouth daily at 12 noon.    Historical Provider, MD    Allergies Review of patient's allergies indicates no known allergies.  No family history on file.  Social History Social History  Substance Use Topics  . Smoking status: Never Smoker  . Smokeless tobacco: Never Used  . Alcohol use No    Review of Systems Constitutional: No fever/chills Eyes: No visual changes. ENT: No sore throat. Cardiovascular: Denies chest pain. Respiratory: Denies shortness of breath. Gastrointestinal: No abdominal pain.  No nausea, no vomiting.  No diarrhea.  No constipation. Genitourinary: Negative for dysuria. Musculoskeletal: Negative for back pain. Skin: Negative for rash. Neurological: Positive for headache, no focal weakness. Positive for numbness.  10-point ROS otherwise negative.  ____________________________________________   PHYSICAL EXAM:  VITAL SIGNS: ED Triage Vitals [07/12/16 0909]  Enc Vitals Group     BP 133/84     Pulse Rate (!) 113     Resp 18     Temp 98.1 F (36.7 C)     Temp  src      SpO2 99 %     Weight      Height      Head Circumference      Peak Flow      Pain Score      Pain Loc      Pain Edu?      Excl. in GC?     Constitutional: Alert and oriented. Well appearing and in no acute distress. Eyes: Conjunctivae are normal. PERRL. EOMI. Head: Atraumatic. Nose: No congestion/rhinnorhea. Mouth/Throat: Mucous membranes are moist.   Oropharynx non-erythematous. Neck: No stridor.   Cardiovascular: Normal rate, regular rhythm. Grossly normal heart sounds.  Good peripheral circulation. Respiratory: Normal respiratory effort.  No retractions. Lungs CTAB. Gastrointestinal: Nontender gravid fundus palpated in the xiphoid and the umbilicus. No CVA tenderness. Genitourinary: Deferred Musculoskeletal: No lower extremity tenderness nor edema.  No joint effusions. Neurologic:  Normal speech and language. No gross focal neurologic deficits are appreciated. No gait instability. 5 out of 5 strength in bilateral upper and lower 70s, sensation intact to light touch throughout, cranial nerves II through XII intact, normal finger-nose-finger. Skin:  Skin is warm, dry and intact. No rash noted. Psychiatric: Mood and affect are normal. Speech and behavior are normal.  ____________________________________________   LABS (all labs ordered are listed, but only abnormal results are displayed)  Labs Reviewed  COMPREHENSIVE METABOLIC PANEL - Abnormal; Notable for the following:       Result Value   CO2 19 (*)    Glucose, Bld 122 (*)    Creatinine, Ser 0.43 (*)    Calcium 8.8 (*)    Total Protein 6.4 (*)    All other components within normal limits  URINALYSIS COMPLETEWITH MICROSCOPIC ([facility] ONLY) - Abnormal; Notable for the following:    Color, Urine STRAW (*)    APPearance CLEAR (*)    Specific Gravity, Urine 1.003 (*)    Bacteria, UA RARE (*)    Squamous Epithelial / LPF 0-5 (*)    All other components within normal limits  ETHANOL  CBC WITH DIFFERENTIAL/PLATELET  PROTIME-INR  APTT   ____________________________________________  EKG  ED ECG REPORT I, Gayla Doss, the attending physician, personally viewed and interpreted this ECG.   Date: 07/12/2016  EKG Time: 09:14  Rate: 103  Rhythm: sinus tachycardia  Axis: normal  Intervals:none  ST&T Change: No acute ST elevation or acute ST depression, baseline wander creates  artifact.  ____________________________________________  RADIOLOGY  CT head IMPRESSION:  Normal head CT.      ____________________________________________   PROCEDURES  Procedure(s) performed: None  Procedures  Critical Care performed: No  ____________________________________________   INITIAL IMPRESSION / ASSESSMENT AND PLAN / ED COURSE  Pertinent labs & imaging results that were available during my care of the patient were reviewed by me and considered in my medical decision making (see chart for details).  Erin Gomez is a 45 y.o. female with no chronic medical problems, G1 P0 at 56 weeks estimated gestational age followed by [facility] presents for evaluation of now resolved blurred vision, right hand numbness and speech difficulty that began at approximately 7 AM. She is not complaining of a moderate headache. On exam, she is very well-appearing and in no acute distress. Mildly tachycardic on arrival however that has resolved prior to any  intervention. The remainder of her vital signs are stable and she is afebrile. She has an intact neurological examination. Code stroke was initiated on arrival however current NIH  stroke scale is 0 and the patient is not a candidate for TPA given rapid resolution of symptoms. She was consented for CT scan, we discussed risk and benefits, we will shield her abdomen. Dr. Thad Ranger at the bedside evaluating. We'll obtain screening labs.  ----------------------------------------- 9:56 AM on 07/12/2016 ----------------------------------------- Patient with continued resolution of symptoms. CBC, CMP, coags show an unremarkable. CT head is normal. Dr. Thad Ranger of neurology has evaluated the patient and agrees that symptoms today are likely secondary to complex migraine, unlikely represent true TIA, not consistent with subarachnoid hemorrhage or cavernous venous sinus thrombosis. We'll treat with Tylenol for headache, continue  brief observation in the emergency department, she continues to appear well, anticipate discharge with close PCP, Neurology and and OB/GYN follow-up. Fetal heart rate 142 bpm, reassuring.  ----------------------------------------- 11:51 AM on 07/12/2016 ----------------------------------------- Patient observed nearly 3 hours in the emergency department that recurrence of any symptoms. Urinalysis is not consistent with infection. DC as above with close follow-up. We discussed  return precautions and she is comfortable with the discharge plan. DC home.  Clinical Course     ____________________________________________   FINAL CLINICAL IMPRESSION(S) / ED DIAGNOSES  Final diagnoses:  Complicated migraine      NEW MEDICATIONS STARTED DURING THIS VISIT:  New Prescriptions   No medications on file     Note:  This document was prepared using Dragon voice recognition software and may include unintentional dictation errors.    Gayla Doss, MD 07/12/16 1152    Gayla Doss, MD 07/12/16 [phone]

## 2016-07-13 ENCOUNTER — Ambulatory Visit (INDEPENDENT_AMBULATORY_CARE_PROVIDER_SITE_OTHER): Payer: BLUE CROSS/BLUE SHIELD | Admitting: Obstetrics and Gynecology

## 2016-07-13 ENCOUNTER — Other Ambulatory Visit: Payer: BLUE CROSS/BLUE SHIELD

## 2016-07-13 VITALS — BP 103/61 | HR 74 | Wt 149.7 lb

## 2016-07-13 DIAGNOSIS — Z23 Encounter for immunization: Secondary | ICD-10-CM | POA: Diagnosis not present

## 2016-07-13 DIAGNOSIS — Z418 Encounter for other procedures for purposes other than remedying health state: Secondary | ICD-10-CM

## 2016-07-13 DIAGNOSIS — G43809 Other migraine, not intractable, without status migrainosus: Secondary | ICD-10-CM

## 2016-07-13 DIAGNOSIS — Z2913 Encounter for prophylactic Rho(D) immune globulin: Secondary | ICD-10-CM

## 2016-07-13 DIAGNOSIS — Z3493 Encounter for supervision of normal pregnancy, unspecified, third trimester: Secondary | ICD-10-CM | POA: Diagnosis not present

## 2016-07-13 DIAGNOSIS — Z131 Encounter for screening for diabetes mellitus: Secondary | ICD-10-CM

## 2016-07-13 DIAGNOSIS — G43009 Migraine without aura, not intractable, without status migrainosus: Secondary | ICD-10-CM | POA: Insufficient documentation

## 2016-07-13 MED ORDER — TETANUS-DIPHTH-ACELL PERTUSSIS 5-2.5-18.5 LF-MCG/0.5 IM SUSP
0.5000 mL | Freq: Once | INTRAMUSCULAR | Status: AC
  Administered 2016-07-13: 0.5 mL via INTRAMUSCULAR

## 2016-07-13 MED ORDER — RHO D IMMUNE GLOBULIN 1500 UNIT/2ML IJ SOSY: 300.0000 ug | PREFILLED_SYRINGE | Freq: Once | INTRAMUSCULAR | Status: DC

## 2016-07-13 NOTE — Progress Notes (Signed)
ROB & Glucola -Tdap & rhogam given. Discussed ED visit and atypical migraines- recommend chiropractor referral.

## 2016-07-13 NOTE — Addendum Note (Signed)
Addended by: Lunette StandsSIEMIENSKI, Londell Noll J on: 07/13/2016 12:10 PM   Modules accepted: Orders

## 2016-07-13 NOTE — Progress Notes (Signed)
ROB- pt went to ER 07/12/16- slurred speech, she seems stressed about the episode glucola done, blood consent, rhogham & tdap given, the er suggested adding magnesium, also pt had neck injury yrs ago, having some headaches

## 2016-07-14 ENCOUNTER — Telehealth: Payer: Self-pay | Admitting: *Deleted

## 2016-07-14 LAB — HEMOGLOBIN AND HEMATOCRIT, BLOOD
Hematocrit: 34.9 % (ref 34.0–46.6)
Hemoglobin: 11.8 g/dL (ref 11.1–15.9)

## 2016-07-14 LAB — GLUCOSE, 1 HOUR GESTATIONAL: Gestational Diabetes Screen: 101 mg/dL (ref 65–139)

## 2016-07-14 NOTE — Telephone Encounter (Signed)
Notified pt of results 

## 2016-07-14 NOTE — Telephone Encounter (Signed)
-----   Message from Purcell NailsMelody N Shambley, [place] sent at 07/14/2016 10:38 AM EDT ----- Please let her know she passed her glucola and no signs of anemia

## 2016-08-03 ENCOUNTER — Ambulatory Visit (INDEPENDENT_AMBULATORY_CARE_PROVIDER_SITE_OTHER): Payer: BLUE CROSS/BLUE SHIELD | Admitting: Obstetrics and Gynecology

## 2016-08-03 ENCOUNTER — Other Ambulatory Visit: Payer: Self-pay | Admitting: Obstetrics and Gynecology

## 2016-08-03 VITALS — BP 119/64 | HR 81 | Wt 153.6 lb

## 2016-08-03 DIAGNOSIS — Z3493 Encounter for supervision of normal pregnancy, unspecified, third trimester: Secondary | ICD-10-CM

## 2016-08-03 DIAGNOSIS — Z23 Encounter for immunization: Secondary | ICD-10-CM | POA: Diagnosis not present

## 2016-08-03 LAB — POCT URINALYSIS DIPSTICK
BILIRUBIN UA: NEGATIVE
GLUCOSE UA: NEGATIVE
Ketones, UA: 5
Nitrite, UA: NEGATIVE
Protein, UA: NEGATIVE
RBC UA: NEGATIVE
Urobilinogen, UA: 0.2
pH, UA: 6.5

## 2016-08-03 NOTE — Progress Notes (Signed)
ROB- pt is doing well, having B calf cramping- worse at night

## 2016-08-03 NOTE — Patient Instructions (Signed)
Postpartum Tubal Ligation Postpartum tubal ligation (PPTL) is a procedure that closes the fallopian tubes right after childbirth or 1-2 days after childbirth. PPTL is done before the uterus returns to its normal location. The procedure is also called a mini-laparotomy. When the fallopian tubes are closed, the eggs that are released from the ovaries cannot enter the uterus, and sperm cannot reach the egg. PPTL is done so you will not be able to get pregnant or have a baby. Although this procedure may be undone (reversed), it should be considered permanent and irreversible. If you want to have future pregnancies, you should not have this procedure. LET YOUR HEALTH CARE PROVIDER KNOW ABOUT:  Any allergies you have.  All medicines you are taking, including vitamins, herbs, eye drops, creams, and over-the-counter medicines. This includes any use of steroids, either by mouth or in cream form.  Previous problems you or members of your family have had with the use of anesthetics.  Any blood disorders you have.  Previous surgeries you have had.  Any medical conditions you may have. RISKS AND COMPLICATIONS  Infection.  Bleeding.  Injury to surrounding organs.  Side effects from anesthetics.  Failure of the procedure.  Ectopic pregnancy.  Future regret about having the procedure done. BEFORE THE PROCEDURE  You may need to sign certain documents, including an informed consent form, up to 30 days before the date of your tubal ligation.  Follow instructions from your health care provider about eating and drinking restrictions. PROCEDURE  If done 1-2 days after a vaginal delivery:  You will be given one or more of the following:  A medicine that helps you relax (sedative).  A medicine that numbs the area (local anesthetic).  A medicine that makes you fall asleep (general anesthetic).  A medicine that is injected into an area of your body that numbs everything below the injection site  (regional anesthetic).  If you have been given general anesthetic, a tube will be put down your throat to help you breathe.  Your bladder may be emptied with a small tube (catheter).  A small cut (incision) will be made just above the pubic hair line.  The fallopian tubes will be located and brought up through the incision.  The fallopian tubes will be tied off or burned (cauterized), or they will be closed with a clamp, ring, or clip. In many cases, a small portion in the center of each fallopian tube will also be removed.  The incision will be closed with stitches (sutures).  A bandage (dressing) will be placed over the incision. The procedure may vary among health care providers and hospitals. If done after a cesarean delivery:  Tubal ligation will be done through the incision that was used for the cesarean delivery of your baby.  After the tubes are closed, the incision will be closed with stitches (sutures).  A bandage (dressing) will be placed over the incision. The procedure may vary among health care providers and hospitals. AFTER THE PROCEDURE  Your blood pressure, heart rate, breathing rate, and blood oxygen level will be monitored often until the medicines you were given have worn off.  You will be given pain medicine as needed.  If you had general anesthetic, you may have some mild discomfort in your throat. This is from the breathing tube that was placed in your throat while you were sleeping.  You may feel tired, and you should rest for the remainder of the day.  You may have some pain   or cramps in the abdominal area for 3-7 days.   This information is not intended to replace advice given to you by your health care provider. Make sure you discuss any questions you have with your health care provider.   Document Released: 10/31/2005 Document Revised: 03/17/2015 Document Reviewed: 02/11/2012 Elsevier Interactive Patient Education 2016 [employer]  

## 2016-08-03 NOTE — Progress Notes (Signed)
ROB- doing well, reviewed normal glucola. Discussed pain management. Considering waterbirth- will attend class. Considering PPTL , flu vaccine given.

## 2016-08-06 LAB — URINE CULTURE: Organism ID, Bacteria: NO GROWTH

## 2016-08-19 ENCOUNTER — Telehealth: Payer: Self-pay | Admitting: Obstetrics and Gynecology

## 2016-08-19 NOTE — Telephone Encounter (Signed)
Spoke with pt about taking some stool softners, adding some prune juice,she voiced understanding

## 2016-08-19 NOTE — Telephone Encounter (Signed)
Pt in a lot pain last night. Pain is around rectum thinks it may be constipation.

## 2016-08-24 ENCOUNTER — Ambulatory Visit (INDEPENDENT_AMBULATORY_CARE_PROVIDER_SITE_OTHER): Payer: BLUE CROSS/BLUE SHIELD | Admitting: Obstetrics and Gynecology

## 2016-08-24 VITALS — BP 120/74 | HR 90 | Wt 153.6 lb

## 2016-08-24 DIAGNOSIS — R12 Heartburn: Secondary | ICD-10-CM

## 2016-08-24 DIAGNOSIS — Z3493 Encounter for supervision of normal pregnancy, unspecified, third trimester: Secondary | ICD-10-CM

## 2016-08-24 DIAGNOSIS — O26899 Other specified pregnancy related conditions, unspecified trimester: Secondary | ICD-10-CM

## 2016-08-24 LAB — POCT URINALYSIS DIPSTICK
BILIRUBIN UA: NEGATIVE
GLUCOSE UA: NEGATIVE
KETONES UA: NEGATIVE
NITRITE UA: NEGATIVE
PH UA: 7
Protein, UA: NEGATIVE
RBC UA: NEGATIVE
SPEC GRAV UA: 1.015
Urobilinogen, UA: 0.2

## 2016-08-24 MED ORDER — OMEPRAZOLE 40 MG PO CPDR: 40.0000 mg | DELAYED_RELEASE_CAPSULE | Freq: Every day | ORAL | 6 refills | Status: DC

## 2016-08-24 NOTE — Progress Notes (Signed)
ROB- pt isnt sleeping at all, having really bad acid reflux -request medication

## 2016-08-24 NOTE — Progress Notes (Signed)
ROB-nightly heartburn not resolved with mylanta or TUMs, RX for Omeprazole sent in.

## 2016-08-24 NOTE — Patient Instructions (Signed)
Heartburn During Pregnancy Heartburn is a burning sensation in the chest caused by stomach acid backing up into the esophagus. Heartburn is common in pregnancy because a certain hormone (progesterone) is released when a woman is pregnant. The progesterone hormone may relax the valve that separates the esophagus from the stomach. This allows acid to go up into the esophagus, causing heartburn. Heartburn may also happen in pregnancy because the enlarging uterus pushes up on the stomach, which pushes more acid into the esophagus. This is especially true in the later stages of pregnancy. Heartburn problems usually go away after giving birth. CAUSES  Heartburn is caused by stomach acid backing up into the esophagus. During pregnancy, this may result from various things, including:   The progesterone hormone.  Changing hormone levels.  The growing uterus pushing stomach acid upward.  Large meals.  Certain foods and drinks.  Exercise.  Increased acid production. SIGNS AND SYMPTOMS   Burning pain in the chest or lower throat.  Bitter taste in the mouth.  Coughing. DIAGNOSIS  Your health care provider will typically diagnose heartburn by taking a careful history of your concern. Blood tests may be done to check for a certain type of bacteria that is associated with heartburn. Sometimes, heartburn is diagnosed by prescribing a heartburn medicine to see if the symptoms improve. In some cases, a procedure called an endoscopy may be done. In this procedure, a tube with a light and a camera on the end (endoscope) is used to examine the esophagus and the stomach. TREATMENT  Treatment will vary depending on the severity of your symptoms. Your health care provider may recommend:  Over-the-counter medicines (antacids, acid reducers) for mild heartburn.  Prescription medicines to decrease stomach acid or to protect your stomach lining.  Certain changes in your diet.  Elevating the head of your bed  by putting blocks under the legs. This helps prevent stomach acid from backing up into the esophagus when you are lying down. HOME CARE INSTRUCTIONS   Only take over-the-counter or prescription medicines as directed by your health care provider.  Raise the head of your bed by putting blocks under the legs if instructed to do so by your health care provider. Sleeping with more pillows is not effective because it only changes the position of your head.  Do not exercise right after eating.  Avoid eating 2-3 hours before bed. Do not lie down right after eating.  Eat small meals throughout the day instead of three large meals.  Identify foods and beverages that make your symptoms worse and avoid them. Foods you may want to avoid include:  Peppers.  Chocolate.  High-fat foods, including fried foods.  Spicy foods.  Garlic and onions.  Citrus fruits, including oranges, grapefruit, lemons, and limes.  Food containing tomatoes or tomato products.  Mint.  Carbonated and caffeinated drinks.  Vinegar. SEEK MEDICAL CARE IF:  You have abdominal pain of any kind.  You feel burning in your upper abdomen or chest, especially after eating or lying down.  You have nausea and vomiting.  Your stomach feels upset after you eat. SEEK IMMEDIATE MEDICAL CARE IF:   You have severe chest pain that goes down your arm or into your jaw or neck.  You feel sweaty, dizzy, or light-headed.  You become short of breath.  You vomit blood.  You have difficulty or pain with swallowing.  You have bloody or black, tarry stools.  You have episodes of heartburn more than 3 times a week,   for more than 2 weeks. MAKE SURE YOU:  Understand these instructions.  Will watch your condition.  Will get help right away if you are not doing well or get worse.   This information is not intended to replace advice given to you by your health care provider. Make sure you discuss any questions you have with  your health care provider.   Document Released: 10/28/2000 Document Revised: 11/21/2014 Document Reviewed: 06/19/2013 Elsevier Interactive Patient Education 2016 Elsevier Inc.  

## 2016-09-06 ENCOUNTER — Telehealth: Payer: Self-pay | Admitting: Obstetrics and Gynecology

## 2016-09-06 NOTE — Telephone Encounter (Signed)
PT CALLED AND HAD TO CX HER APPT FOR NEXT WEEK BECAUSE SHE IS GO[place] SHE ALREADY HAD AN APPT SET UP FOR THE FOLLOWING WEEK, I TLD HER IF SHE IS DOING FINE THEN THERE WAS NO NEED TO RESCHEDULED THE APPT SINCE SHE ALREADY HAD ONE THE FOLLOWING WEEK, AND IF SOMETHING WAS TO COME UP TO CALL US, SHE STATED THAT HER DRIVE WAS 7 HOURS, I TOLD HER TO MAKE SURE SHE STOPPED EVERY 1 1/2 HOURS TO GET OUT AND STRETCH ND STAY HYDRATED THE ENTIRE TRIP, IF THERE IS ANYTHING ELSE YOU NEED TO TELL HER ABOUT TAKING THE TRIP AND PRECAUTIONS JUST CALL HER.

## 2016-09-07 ENCOUNTER — Telehealth: Payer: Self-pay | Admitting: Obstetrics and Gynecology

## 2016-09-07 NOTE — Telephone Encounter (Signed)
THIS  PT [redacted] WKS PREGNANT AND IS HAVING STABBING BACK PAIN ON THE LEFT SIDE, SHE HAS NEVER HAD A KIDNEY STONE. SHE WANTS SOME ADVICE.

## 2016-09-07 NOTE — Telephone Encounter (Signed)
OB 35 2/7 having left sided back pain that comes and goes. Stabbing at times and radiates to her bladder. She is drinking lots of h20 and cranberry juice. NO n/v. No h/o kidney stones. NO uti sx. NO fever, lof, or vb. Pos FM. Did take tylenol this am and it helped. Using heating pad on low which seems to help also. Advised pt to continue with tylenol q 6h and use heating pad. Push fluids. If pain increases and not relived with tylenol or lof, vb, or decreased FM she will need to contact office asap. Pt voices understanding.

## 2016-09-14 ENCOUNTER — Encounter: Payer: BLUE CROSS/BLUE SHIELD | Admitting: Obstetrics and Gynecology

## 2016-09-20 ENCOUNTER — Ambulatory Visit (INDEPENDENT_AMBULATORY_CARE_PROVIDER_SITE_OTHER): Payer: BLUE CROSS/BLUE SHIELD | Admitting: Obstetrics and Gynecology

## 2016-09-20 VITALS — BP 107/81 | HR 95 | Wt 151.0 lb

## 2016-09-20 DIAGNOSIS — Z3493 Encounter for supervision of normal pregnancy, unspecified, third trimester: Secondary | ICD-10-CM

## 2016-09-20 DIAGNOSIS — Z113 Encounter for screening for infections with a predominantly sexual mode of transmission: Secondary | ICD-10-CM

## 2016-09-20 DIAGNOSIS — Z3685 Encounter for antenatal screening for Streptococcus B: Secondary | ICD-10-CM

## 2016-09-20 LAB — POCT URINALYSIS DIPSTICK
Bilirubin, UA: NEGATIVE
Blood, UA: NEGATIVE
Glucose, UA: NEGATIVE
KETONES UA: NEGATIVE
Nitrite, UA: NEGATIVE
PH UA: 6.5
PROTEIN UA: NEGATIVE
Spec Grav, UA: 1.01
UROBILINOGEN UA: 0.2

## 2016-09-20 NOTE — Progress Notes (Signed)
ROB- cultures obtained, pt is having a lot of pelvic pressure 

## 2016-09-20 NOTE — Progress Notes (Signed)
ROB- Patient feeling that she is very small amounts of fluid over the past two weeks. Cultures obtained, Nitrozine was negative with no signs of leaking fluid on sterile speculum exam. Encouraged increased water intake. Follow up in 1 week scheduled.

## 2016-09-20 NOTE — Progress Notes (Signed)
ROB- agree with assessment and POC

## 2016-09-22 ENCOUNTER — Other Ambulatory Visit: Payer: Self-pay | Admitting: Obstetrics and Gynecology

## 2016-09-22 DIAGNOSIS — O99820 Streptococcus B carrier state complicating pregnancy: Secondary | ICD-10-CM

## 2016-09-22 LAB — GC/CHLAMYDIA PROBE AMP
Chlamydia trachomatis, NAA: NEGATIVE
Neisseria gonorrhoeae by PCR: NEGATIVE

## 2016-09-22 LAB — STREP GP B NAA: Strep Gp B NAA: POSITIVE — AB

## 2016-09-27 ENCOUNTER — Ambulatory Visit (INDEPENDENT_AMBULATORY_CARE_PROVIDER_SITE_OTHER): Payer: BLUE CROSS/BLUE SHIELD | Admitting: Obstetrics and Gynecology

## 2016-09-27 VITALS — BP 112/75 | HR 108 | Wt 154.2 lb

## 2016-09-27 DIAGNOSIS — J069 Acute upper respiratory infection, unspecified: Secondary | ICD-10-CM | POA: Insufficient documentation

## 2016-09-27 DIAGNOSIS — Z3493 Encounter for supervision of normal pregnancy, unspecified, third trimester: Secondary | ICD-10-CM

## 2016-09-27 LAB — POCT URINALYSIS DIPSTICK
BILIRUBIN UA: NEGATIVE
Blood, UA: NEGATIVE
GLUCOSE UA: NEGATIVE
KETONES UA: NEGATIVE
Nitrite, UA: NEGATIVE
Protein, UA: NEGATIVE
Urobilinogen, UA: 0.2
pH, UA: 7

## 2016-09-27 MED ORDER — CEFDINIR 300 MG PO CAPS: 300.0000 mg | ORAL_CAPSULE | Freq: Two times a day (BID) | ORAL | 1 refills | Status: DC

## 2016-09-27 MED ORDER — ALBUTEROL SULFATE HFA 108 (90 BASE) MCG/ACT IN AERS: 2.0000 | INHALATION_SPRAY | RESPIRATORY_TRACT | 1 refills | Status: DC | PRN

## 2016-09-27 NOTE — Progress Notes (Signed)
ROB- pt is having a cough- deep in her chest, lots of drainage in her throat x5 days Pt is having some pelvic pressure

## 2016-09-27 NOTE — Progress Notes (Signed)
ROB-URI with green productive cough, mild rhonchi noted bilaterally, rx sent in for proAir inhaler and omnicef 300mg  bid.

## 2016-10-04 ENCOUNTER — Ambulatory Visit (INDEPENDENT_AMBULATORY_CARE_PROVIDER_SITE_OTHER): Payer: BLUE CROSS/BLUE SHIELD | Admitting: Obstetrics and Gynecology

## 2016-10-04 VITALS — BP 106/69 | HR 88 | Wt 154.6 lb

## 2016-10-04 DIAGNOSIS — Z3493 Encounter for supervision of normal pregnancy, unspecified, third trimester: Secondary | ICD-10-CM

## 2016-10-04 LAB — POCT URINALYSIS DIPSTICK
BILIRUBIN UA: NEGATIVE
GLUCOSE UA: NEGATIVE
KETONES UA: NEGATIVE
LEUKOCYTES UA: NEGATIVE
Nitrite, UA: NEGATIVE
Spec Grav, UA: 1.01
Urobilinogen, UA: 0.2
pH, UA: 6

## 2016-10-04 NOTE — Progress Notes (Signed)
ROB-`labor precautions discussed  Has hired a Medical laboratory scientific officerDoula and plans hydrotherapy use.Discussed saline lock for GBS prophylaxis and birth plan.

## 2016-10-04 NOTE — Progress Notes (Signed)
ROB- pt is having a lot of pelvic pressure, still with head and chest congestion, R ear pain

## 2016-10-08 ENCOUNTER — Observation Stay
Admission: EM | Admit: 2016-10-08 | Discharge: 2016-10-08 | Disposition: A | Discharge status: Home / Self Care | Payer: BLUE CROSS/BLUE SHIELD | Attending: Obstetrics and Gynecology | Admitting: Obstetrics and Gynecology

## 2016-10-08 DIAGNOSIS — O368130 Decreased fetal movements, third trimester, not applicable or unspecified: Principal | ICD-10-CM | POA: Insufficient documentation

## 2016-10-08 DIAGNOSIS — O09513 Supervision of elderly primigravida, third trimester: Secondary | ICD-10-CM

## 2016-10-08 DIAGNOSIS — Z3A39 39 weeks gestation of pregnancy: Secondary | ICD-10-CM | POA: Diagnosis not present

## 2016-10-08 NOTE — OB Triage Provider Note (Signed)
L&D OB Triage Note  Erin MaidensChristine L Monarrez is a 45 y.o. G9P0080 female at 8160w5d, EDD Estimated Date of Delivery: 10/10/16 who presented to triage for complaints of persistent cough and decreased fetal movement.  She was evaluated by the nurses with no significant findings/findings significant for fetal distress or labor. Vital signs stable with O2 sat's 97-100 on room air. An NST was performed and has been reviewed by Me. She was instructed to return to ED for evaluation of bronchitis.   NST INTERPRETATION: Indications: decreased fetal movement  Mode: External Baseline Rate (A): 135 bpm Variability: Moderate Accelerations: 15 x 15 Decelerations: None     Contraction Frequency (min): 2-6+  Impression: reactive   Plan: NST performed was reviewed and was found to be reactive. She was discharged home with bleeding/labor precautions.  Continue routine prenatal care. Follow up with OB/GYN as previously scheduled.     Jasman Pfeifle Suzan NailerN [place], CNM

## 2016-10-08 NOTE — OB Triage Note (Signed)
Ms. Erin Gomez here with decreased fetal movement X 2 days, recent URI with course of antibiotics, inhaler and home use of neti pot. Pt denies bleeding, LOF, contractions.

## 2016-10-08 NOTE — Progress Notes (Signed)
Patient has marked fetal movement 25 times since placing fetal U/S - pt reports verbally that she feels good fetal movement

## 2016-10-08 NOTE — Discharge Instructions (Signed)
Your are encouraged to be seen for further evaluation of respiratory symptoms by a medical provider

## 2016-10-10 ENCOUNTER — Other Ambulatory Visit: Payer: Self-pay | Admitting: *Deleted

## 2016-10-10 ENCOUNTER — Telehealth: Payer: Self-pay | Admitting: Obstetrics and Gynecology

## 2016-10-10 MED ORDER — CEFDINIR 300 MG PO CAPS: 300.0000 mg | ORAL_CAPSULE | Freq: Two times a day (BID) | ORAL | 1 refills | Status: DC

## 2016-10-10 NOTE — Telephone Encounter (Signed)
Refilled pts medicine, pt voiced understanding

## 2016-10-10 NOTE — Telephone Encounter (Signed)
Erin CanesChristine was in L& D Sat. She said she has a sinis infection and was given Cefdinir. She cannot get rid of it and said she wants another refill. Someone told her to go to the ER but her copay is $600. Can you send that in.

## 2016-10-13 ENCOUNTER — Inpatient Hospital Stay
Admission: EM | Admit: 2016-10-13 | Discharge: 2016-10-16 | DRG: 775 | Disposition: A | Discharge status: Home / Self Care | Payer: BLUE CROSS/BLUE SHIELD | Attending: Obstetrics and Gynecology | Admitting: Obstetrics and Gynecology

## 2016-10-13 ENCOUNTER — Encounter: Payer: Self-pay | Admitting: *Deleted

## 2016-10-13 DIAGNOSIS — O09519 Supervision of elderly primigravida, unspecified trimester: Secondary | ICD-10-CM

## 2016-10-13 DIAGNOSIS — O9081 Anemia of the puerperium: Secondary | ICD-10-CM | POA: Diagnosis not present

## 2016-10-13 DIAGNOSIS — O26899 Other specified pregnancy related conditions, unspecified trimester: Secondary | ICD-10-CM

## 2016-10-13 DIAGNOSIS — A6009 Herpesviral infection of other urogenital tract: Secondary | ICD-10-CM | POA: Diagnosis present

## 2016-10-13 DIAGNOSIS — Z3493 Encounter for supervision of normal pregnancy, unspecified, third trimester: Secondary | ICD-10-CM | POA: Diagnosis present

## 2016-10-13 DIAGNOSIS — Z6791 Unspecified blood type, Rh negative: Secondary | ICD-10-CM | POA: Diagnosis not present

## 2016-10-13 DIAGNOSIS — K5900 Constipation, unspecified: Secondary | ICD-10-CM | POA: Diagnosis present

## 2016-10-13 DIAGNOSIS — O99824 Streptococcus B carrier state complicating childbirth: Secondary | ICD-10-CM | POA: Diagnosis present

## 2016-10-13 DIAGNOSIS — O26893 Other specified pregnancy related conditions, third trimester: Secondary | ICD-10-CM | POA: Diagnosis present

## 2016-10-13 DIAGNOSIS — D693 Immune thrombocytopenic purpura: Secondary | ICD-10-CM | POA: Diagnosis present

## 2016-10-13 DIAGNOSIS — O99820 Streptococcus B carrier state complicating pregnancy: Secondary | ICD-10-CM

## 2016-10-13 DIAGNOSIS — Z3A40 40 weeks gestation of pregnancy: Secondary | ICD-10-CM

## 2016-10-13 DIAGNOSIS — O480 Post-term pregnancy: Secondary | ICD-10-CM | POA: Diagnosis present

## 2016-10-13 DIAGNOSIS — D649 Anemia, unspecified: Secondary | ICD-10-CM | POA: Diagnosis not present

## 2016-10-13 DIAGNOSIS — Z862 Personal history of diseases of the blood and blood-forming organs and certain disorders involving the immune mechanism: Secondary | ICD-10-CM

## 2016-10-13 DIAGNOSIS — O9912 Other diseases of the blood and blood-forming organs and certain disorders involving the immune mechanism complicating childbirth: Secondary | ICD-10-CM | POA: Diagnosis present

## 2016-10-13 DIAGNOSIS — O09513 Supervision of elderly primigravida, third trimester: Secondary | ICD-10-CM

## 2016-10-13 HISTORY — DX: Personal history of diseases of the blood and blood-forming organs and certain disorders involving the immune mechanism: Z86.2

## 2016-10-13 LAB — CBC
HEMATOCRIT: 38.3 % (ref 35.0–47.0)
HEMOGLOBIN: 12.9 g/dL (ref 12.0–16.0)
MCH: 29.8 pg (ref 26.0–34.0)
MCHC: 33.8 g/dL (ref 32.0–36.0)
MCV: 88.1 fL (ref 80.0–100.0)
Platelets: 192 10*3/uL (ref 150–440)
RBC: 4.35 MIL/uL (ref 3.80–5.20)
RDW: 13.4 % (ref 11.5–14.5)
WBC: 12.6 10*3/uL — ABNORMAL HIGH (ref 3.6–11.0)

## 2016-10-13 LAB — RAPID HIV SCREEN (HIV 1/2 AB+AG)
HIV 1/2 ANTIBODIES: NONREACTIVE
HIV-1 P24 Antigen - HIV24: NONREACTIVE

## 2016-10-13 LAB — TYPE AND SCREEN
ABO/RH(D): A NEG
ANTIBODY SCREEN: NEGATIVE

## 2016-10-13 MED ORDER — OXYCODONE-ACETAMINOPHEN 5-325 MG PO TABS: 2.0000 | ORAL_TABLET | ORAL | Status: DC | PRN

## 2016-10-13 MED ORDER — SODIUM CHLORIDE FLUSH 0.9 % IV SOLN
INTRAVENOUS | Status: AC
  Filled 2016-10-13: qty 10

## 2016-10-13 MED ORDER — ONDANSETRON HCL 4 MG/2ML IJ SOLN
4.0000 mg | Freq: Four times a day (QID) | INTRAMUSCULAR | Status: DC | PRN
  Administered 2016-10-14: 4 mg via INTRAVENOUS
  Filled 2016-10-13: qty 2

## 2016-10-13 MED ORDER — OXYTOCIN 40 UNITS IN LACTATED RINGERS INFUSION - SIMPLE MED: 2.5000 [IU]/h | INTRAVENOUS | Status: DC

## 2016-10-13 MED ORDER — LIDOCAINE HCL (PF) 1 % IJ SOLN
30.0000 mL | INTRAMUSCULAR | Status: AC | PRN
  Administered 2016-10-14: 30 mL via SUBCUTANEOUS

## 2016-10-13 MED ORDER — MISOPROSTOL 200 MCG PO TABS
ORAL_TABLET | ORAL | Status: AC
  Filled 2016-10-13: qty 4

## 2016-10-13 MED ORDER — OXYTOCIN 10 UNIT/ML IJ SOLN
INTRAMUSCULAR | Status: AC
  Filled 2016-10-13: qty 2

## 2016-10-13 MED ORDER — LACTATED RINGERS IV SOLN
INTRAVENOUS | Status: DC
  Administered 2016-10-13: 19:00:00 via INTRAVENOUS

## 2016-10-13 MED ORDER — SODIUM CHLORIDE 0.9 % IV SOLN
1.0000 g | INTRAVENOUS | Status: DC
  Administered 2016-10-14 (×3): 1 g via INTRAVENOUS
  Filled 2016-10-13 (×6): qty 1000

## 2016-10-13 MED ORDER — OXYTOCIN BOLUS FROM INFUSION: 500.0000 mL | Freq: Once | INTRAVENOUS | Status: DC

## 2016-10-13 MED ORDER — LACTATED RINGERS IV SOLN
500.0000 mL | INTRAVENOUS | Status: DC | PRN
Start: 2016-10-13 — End: 2016-10-14

## 2016-10-13 MED ORDER — SOD CITRATE-CITRIC ACID 500-334 MG/5ML PO SOLN: 30.0000 mL | ORAL | Status: DC | PRN

## 2016-10-13 MED ORDER — ACETAMINOPHEN 325 MG PO TABS
650.0000 mg | ORAL_TABLET | ORAL | Status: DC | PRN
  Administered 2016-10-14: 650 mg via ORAL
  Filled 2016-10-13: qty 2

## 2016-10-13 MED ORDER — OXYCODONE-ACETAMINOPHEN 5-325 MG PO TABS: 1.0000 | ORAL_TABLET | ORAL | Status: DC | PRN

## 2016-10-13 MED ORDER — SODIUM CHLORIDE 0.9 % IV SOLN: 2.0000 g | Freq: Once | INTRAVENOUS | Status: DC

## 2016-10-13 MED ORDER — SODIUM CHLORIDE FLUSH 0.9 % IV SOLN
INTRAVENOUS | Status: AC
  Filled 2016-10-13: qty 30

## 2016-10-13 MED ORDER — LIDOCAINE HCL (PF) 1 % IJ SOLN
INTRAMUSCULAR | Status: AC
  Administered 2016-10-14: 30 mL via SUBCUTANEOUS
  Filled 2016-10-13: qty 30

## 2016-10-13 MED ORDER — SODIUM CHLORIDE 0.9 % IV SOLN
INTRAVENOUS | Status: AC
  Filled 2016-10-13: qty 2000

## 2016-10-13 MED ORDER — AMMONIA AROMATIC IN INHA
RESPIRATORY_TRACT | Status: AC
  Filled 2016-10-13: qty 10

## 2016-10-13 MED ORDER — FENTANYL CITRATE (PF) 100 MCG/2ML IJ SOLN: 50.0000 ug | INTRAMUSCULAR | Status: DC | PRN

## 2016-10-13 NOTE — H&P (Addendum)
Obstetric History and Physical  Erin Gomez is a 45 y.o. [phone] with IUP at [redacted]w[redacted]d presenting for complaints of contractions beginning this morning (~ 10:00 a.m.). Patient states she has been having  regular, every 4-6 minutes contractions, no vaginal bleeding, intact membranes, with active fetal movement.    Prenatal Course Source of Care: [facility] with onset of care at 8 weeks  Pregnancy complications or risks: Patient Active Problem List   Diagnosis Date Noted  . History of ITP 10/14/2016  . Labor and delivery indication for care or intervention 10/13/2016  . Labor and delivery, indication for care 10/08/2016  . URI (upper respiratory infection) 09/27/2016  . GBS (group B Streptococcus carrier), +RV culture, currently pregnant 09/22/2016  . Heartburn during pregnancy 08/24/2016  . Atypical migraine 07/13/2016  . AMA (advanced maternal age) primigravida 35+ 04/20/2016  . Herpes genitalis in women 04/20/2016  . Rh negative state in antepartum period 03/01/2016   She plans to breastfeed She desires bilateral tubal ligation for postpartum contraception.   Prenatal labs and studies: ABO, Rh: A/Negative/-- (04/17 1454) Antibody: Negative (04/17 1454) Rubella: 2.73 (04/17 1454) RPR: Non Reactive (04/17 1454)  HBsAg: Negative (04/17 1454)  HIV: Non Reactive (04/17 1454)  AVW:UJWJXBJY (11/07 1530) 1 hr Glucola  Normal (101) Genetic screening normal Anatomy US normal   Past Medical History:  Diagnosis Date  . History of ITP 10/14/2016  . History of miscarriage, currently pregnant   . Vaginal Pap smear, abnormal     Past Surgical History:  Procedure Laterality Date  . LEEP      OB History  Gravida Para Term Preterm AB Living  9       8    SAB TAB Ectopic Multiple Live Births  8            # Outcome Date GA Lbr Len/2nd Weight Sex Delivery Anes PTL Lv  9 Current           8 SAB           7 SAB           6 SAB           5 SAB           4 SAB            3 SAB           2 SAB           1 SAB               Social History   Social History  . Marital status: Married    Spouse name: N/A  . Number of children: N/A  . Years of education: N/A   Social History Main Topics  . Smoking status: Never Smoker  . Smokeless tobacco: Never Used  . Alcohol use No  . Drug use: No  . Sexual activity: Yes   Other Topics Concern  . None   Social History Narrative  . None    History reviewed. No pertinent family history.  Facility-Administered Medications Prior to Admission  Medication Dose Route Frequency Provider Last Rate Last Dose  . rho (d) immune globulin (RHIG/RHOPHYLAC) injection 300 mcg  300 mcg Intramuscular Once Melody [employer]KE, CNM       Prescriptions Prior to Admission  Medication Sig Dispense Refill Last Dose  . acyclovir (ZOVIRAX) 400 MG tablet Take 1 tablet (400 mg total) by mouth 2 (two) times daily. 60 tablet  4 10/12/2016 at Unknown time  . albuterol (PROAIR HFA) 108 (90 Base) MCG/ACT inhaler Inhale 2 puffs into the lungs every 4 (four) hours as needed for wheezing or shortness of breath. 1 Inhaler 1 Past Week at Unknown time  . cefdinir (OMNICEF) 300 MG capsule Take 1 capsule (300 mg total) by mouth 2 (two) times daily. 14 capsule 1 10/13/2016 at Unknown time  . folic acid (FOLVITE) 1 MG tablet Take 1 tablet (1 mg total) by mouth daily. 90 tablet 1 10/12/2016 at Unknown time  . omeprazole (PRILOSEC) 40 MG capsule Take 1 capsule (40 mg total) by mouth daily. 30 capsule 6 Past Week at Unknown time  . ondansetron (ZOFRAN) 8 MG tablet Take 1 tablet (8 mg total) by mouth every 8 (eight) hours as needed for nausea or vomiting. Pt would like oval shaped pill 20 tablet 3 10/13/2016 at Unknown time  . Prenatal Vit-Fe Fumarate-FA (PRENATAL MULTIVITAMIN) TABS tablet Take 1 tablet by mouth daily at 12 noon.   10/12/2016 at Unknown time    Allergies  Allergen Reactions  . Adhesive [Tape] Rash    Review of Systems: Negative  except for what is mentioned in HPI.  Physical Exam: BP 113/72 (BP Location: Right Arm)   Pulse 82   Temp (!) 96.7 F (35.9 C) (Axillary)   Resp 20   Ht 5\' 4"  (1.626 m)   Wt 154 lb (69.9 kg)   LMP 01/04/2016   BMI 26.43 kg/m  CONSTITUTIONAL: Well-developed, well-nourished female in no acute distress.  HENT:  Normocephalic, atraumatic, External right and left ear normal. Oropharynx is clear and moist EYES: Conjunctivae and EOM are normal. Pupils are equal, round, and reactive to light. No scleral icterus.  NECK: Normal range of motion, supple, no masses SKIN: Skin is warm and dry. No rash noted. Not diaphoretic. No erythema. No pallor. NEUROLOGIC: Alert and oriented to person, place, and time. Normal reflexes, muscle tone coordination. No cranial nerve deficit noted. PSYCHIATRIC: Normal mood and affect. Normal behavior. Normal judgment and thought content. CARDIOVASCULAR: Normal heart rate noted, regular rhythm RESPIRATORY: Effort and breath sounds normal, no problems with respiration noted ABDOMEN: Soft, nontender, nondistended, gravid. MUSCULOSKELETAL: Normal range of motion. No edema and no tenderness. 2+ distal pulses.  Cervical Exam: Dilatation 4 cm   Effacement 100%   Station -2 . External genitalia without lesions. Vaginal exam with no visible lesions.  Presentation: cephalic FHT:  Baseline rate 140 bpm   Variability moderate  Accelerations present   Decelerations none Contractions: Every 2-6 mins   Pertinent Labs/Studies:   Results for orders placed or performed during the hospital encounter of 10/13/16 (from the past 24 hour(s))  CBC     Status: Abnormal   Collection Time: 10/13/16  6:06 PM  Result Value Ref Range   WBC 12.6 (H) 3.6 - 11.0 K/uL   RBC 4.35 3.80 - 5.20 MIL/uL   Hemoglobin 12.9 12.0 - 16.0 g/dL   HCT 65.738.3 84.635.0 - 96.247.0 %   MCV 88.1 80.0 - 100.0 fL   MCH 29.8 26.0 - 34.0 pg   MCHC 33.8 32.0 - 36.0 g/dL   RDW 95.213.4 84.111.5 - 32.414.5 %   Platelets 192 150 - 440  K/uL  Type and screen [facility]     Status: None   Collection Time: 10/13/16  6:06 PM  Result Value Ref Range   ABO/RH(D) A NEG    Antibody Screen NEG    Sample Expiration 10/16/2016   Rapid  HIV screen (HIV 1/2 Ab+Ag) ([facility] Only)     Status: None   Collection Time: 10/13/16  6:06 PM  Result Value Ref Range   HIV-1 P24 Antigen - HIV24 NON REACTIVE NON REACTIVE   HIV 1/2 Antibodies NON REACTIVE NON REACTIVE   Interpretation (HIV Ag Ab)      A non reactive test result means that HIV 1 or HIV 2 antibodies and HIV 1 p24 antigen were not detected in the specimen.    Assessment : Erin Gomez is a 45 y.o. 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 Also with h/o ITP.   Plan: Labor: Expectant management.  Patient declines active management at this time. Desires minimal intervention, hydrotherapy, intermittent monitoring. Patient with normal platelet count, is not at risk of spontaneous bleeding during delivery, or in case of need for regional anesthesia for emergency.  FWB: Reassuring fetal heart tracing.  GBS positive, will treat with Ampicillin.  Delivery plan: Hopeful for vaginal delivery.  To assess for need for Rhogam postpartum.     Hildred LaserAnika Indria Bishara, MD [facility]

## 2016-10-13 NOTE — OB Triage Note (Signed)
Pt. Started contracting this morning about 10:00 a.m. Denies sudden gush of fluid, denies vaginal bleeding, positive for fetal movement. U/S applied - FHR 135-140s, Toco applied - abd with contraction and adequate rest.

## 2016-10-14 ENCOUNTER — Encounter: Payer: Self-pay | Admitting: Obstetrics and Gynecology

## 2016-10-14 DIAGNOSIS — Z3493 Encounter for supervision of normal pregnancy, unspecified, third trimester: Secondary | ICD-10-CM

## 2016-10-14 DIAGNOSIS — Z862 Personal history of diseases of the blood and blood-forming organs and certain disorders involving the immune mechanism: Secondary | ICD-10-CM

## 2016-10-14 HISTORY — DX: Personal history of diseases of the blood and blood-forming organs and certain disorders involving the immune mechanism: Z86.2

## 2016-10-14 LAB — RPR: RPR: NONREACTIVE

## 2016-10-14 MED ORDER — DIPHENHYDRAMINE HCL 25 MG PO CAPS: 25.0000 mg | ORAL_CAPSULE | Freq: Four times a day (QID) | ORAL | Status: DC | PRN

## 2016-10-14 MED ORDER — WITCH HAZEL-GLYCERIN EX PADS
MEDICATED_PAD | CUTANEOUS | Status: AC
  Filled 2016-10-14: qty 100

## 2016-10-14 MED ORDER — COCONUT OIL OIL: 1.0000 "application " | TOPICAL_OIL | Status: DC | PRN

## 2016-10-14 MED ORDER — ONDANSETRON HCL 4 MG/2ML IJ SOLN: 4.0000 mg | INTRAMUSCULAR | Status: DC | PRN

## 2016-10-14 MED ORDER — OXYTOCIN 10 UNIT/ML IJ SOLN
INTRAMUSCULAR | Status: AC
  Administered 2016-10-14: 40 [IU] via INTRAVENOUS
  Filled 2016-10-14: qty 2

## 2016-10-14 MED ORDER — ONDANSETRON HCL 4 MG PO TABS
4.0000 mg | ORAL_TABLET | ORAL | Status: DC | PRN
  Administered 2016-10-16: 4 mg via ORAL
  Filled 2016-10-14: qty 1

## 2016-10-14 MED ORDER — ACETAMINOPHEN 325 MG PO TABS: 650.0000 mg | ORAL_TABLET | ORAL | Status: DC | PRN

## 2016-10-14 MED ORDER — DOCUSATE SODIUM 100 MG PO CAPS
100.0000 mg | ORAL_CAPSULE | Freq: Two times a day (BID) | ORAL | Status: DC
  Administered 2016-10-14 – 2016-10-16 (×4): 100 mg via ORAL
  Filled 2016-10-14 (×4): qty 1

## 2016-10-14 MED ORDER — DOCUSATE SODIUM 100 MG PO CAPS
100.0000 mg | ORAL_CAPSULE | Freq: Once | ORAL | Status: AC
  Administered 2016-10-14: 100 mg via ORAL
  Filled 2016-10-14: qty 1

## 2016-10-14 MED ORDER — PRENATAL MULTIVITAMIN CH
1.0000 | ORAL_TABLET | Freq: Every day | ORAL | Status: DC
  Administered 2016-10-15 – 2016-10-16 (×2): 1 via ORAL
  Filled 2016-10-14 (×2): qty 1

## 2016-10-14 MED ORDER — WITCH HAZEL-GLYCERIN EX PADS: 1.0000 "application " | MEDICATED_PAD | CUTANEOUS | Status: DC | PRN

## 2016-10-14 MED ORDER — BENZOCAINE-MENTHOL 20-0.5 % EX AERO
INHALATION_SPRAY | CUTANEOUS | Status: AC
  Filled 2016-10-14: qty 56

## 2016-10-14 MED ORDER — SODIUM CHLORIDE FLUSH 0.9 % IV SOLN
INTRAVENOUS | Status: AC
  Filled 2016-10-14: qty 10

## 2016-10-14 MED ORDER — DIBUCAINE 1 % RE OINT: 1.0000 "application " | TOPICAL_OINTMENT | RECTAL | Status: DC | PRN

## 2016-10-14 MED ORDER — BENZOCAINE-MENTHOL 20-0.5 % EX AERO
1.0000 "application " | INHALATION_SPRAY | CUTANEOUS | Status: DC | PRN
  Administered 2016-10-14: 1 via TOPICAL
  Filled 2016-10-14: qty 56

## 2016-10-14 MED ORDER — IBUPROFEN 600 MG PO TABS
ORAL_TABLET | ORAL | Status: AC
  Administered 2016-10-14: 600 mg via ORAL
  Filled 2016-10-14: qty 1

## 2016-10-14 MED ORDER — SIMETHICONE 80 MG PO CHEW
80.0000 mg | CHEWABLE_TABLET | ORAL | Status: DC | PRN
  Administered 2016-10-16: 80 mg via ORAL
  Filled 2016-10-14 (×2): qty 1

## 2016-10-14 MED ORDER — IBUPROFEN 600 MG PO TABS
600.0000 mg | ORAL_TABLET | Freq: Four times a day (QID) | ORAL | Status: DC
  Administered 2016-10-14 – 2016-10-16 (×10): 600 mg via ORAL
  Filled 2016-10-14 (×9): qty 1

## 2016-10-14 MED ORDER — ZOLPIDEM TARTRATE 5 MG PO TABS: 5.0000 mg | ORAL_TABLET | Freq: Every evening | ORAL | Status: DC | PRN

## 2016-10-14 MED ORDER — HYDROCODONE-ACETAMINOPHEN 5-325 MG PO TABS
1.0000 | ORAL_TABLET | ORAL | Status: DC | PRN
  Administered 2016-10-14 – 2016-10-16 (×8): 1 via ORAL
  Filled 2016-10-14 (×4): qty 1
  Filled 2016-10-14 (×2): qty 2
  Filled 2016-10-14 (×2): qty 1

## 2016-10-14 NOTE — Assessment & Plan Note (Deleted)
Diagnosed at age 45.

## 2016-10-14 NOTE — Lactation Note (Signed)
This note was copied from a baby's chart. Lactation Consultation Note  Patient Name: Erin Gomez Today's Date: 10/14/2016 Reason for consult: Initial assessment   Maternal Data Formula Feeding for Exclusion: No Does the patient have breastfeeding experience prior to this delivery?: No Can latch well to right breast, left nipple sl flat and firm around areola, baby can't grasp nipple, will continue to try this breast after nursing on right, may need electric breast pump to soften areola and lenghten nipple on left. Feeding Feeding Type: Breast Fed Length of feed:  (poor latch on left, latched easily to right )  LATCH Score/Interventions Latch: Grasps breast easily, tongue down, lips flanged, rhythmical sucking. (on right breast)  Audible Swallowing: Spontaneous and intermittent  Type of Nipple: Everted at rest and after stimulation (left nipple sl flat, firm around areola)  Comfort (Breast/Nipple): Filling, red/small blisters or bruises, mild/mod discomfort  Problem noted: Cracked, bleeding, blisters, bruises;Mild/Moderate discomfort Interventions  (Cracked/bleeding/bruising/blister): Other (comment) (deeper latch) Interventions (Mild/moderate discomfort):  (coconut oil to nipples)  Hold (Positioning): Assistance needed to correctly position infant at breast and maintain latch. Intervention(s): Breastfeeding basics reviewed;Support Pillows;Position options;Skin to skin  LATCH Score: 8  Lactation Tools Discussed/Used WIC Program: No   Consult Status Consult Status: Follow-up Date: 10/15/16 Follow-up type: In-patient    Erin Gomez 10/14/2016, 6:21 PM

## 2016-10-14 NOTE — Progress Notes (Signed)
Up to BR with assist.  Walks independently.  Pericare performed. Gown changed.  To wheelchair.

## 2016-10-14 NOTE — Progress Notes (Signed)
Intrapartum Progress Note  S: Patient notes mild to moderate pain with ctx.   O: Blood pressure 113/80, pulse (!) 106, temperature 97.9 F (36.6 C), temperature source Oral, resp. rate 19, height 5\' 4"  (1.626 m), weight 154 lb (69.9 kg), last menstrual period 01/04/2016. Gen App: NAD, comfortable Abdomen: soft, gravid FHT: baseline 140 bpm.  Accels present.  Decels absent. moderate in degree variability.   Tocometer: contractions q 2-8 minutes, irregular Cervix: 4.5/100/0/intact Extremities: Nontender, no edema.  Labs: No new labs   Assessment:  1: SIUP at 7057w4d, post-dates 2. Advanced maternal age 89. H/o ITP, normal platelets noted today 4. H/o recurrent pregnancy loss  Plan:  1. Continue expectant management. Can continue laboring in tub, ambulate. Encouraged nipple stimulation. Offered membrane sweeping, but patient declined. Declines pain meds.     Hildred LaserAnika Antonie Borjon, MD

## 2016-10-15 LAB — CBC
HCT: 28 % — ABNORMAL LOW (ref 35.0–47.0)
Hemoglobin: 9.7 g/dL — ABNORMAL LOW (ref 12.0–16.0)
MCH: 30.8 pg (ref 26.0–34.0)
MCHC: 34.5 g/dL (ref 32.0–36.0)
MCV: 89.1 fL (ref 80.0–100.0)
PLATELETS: 160 10*3/uL (ref 150–440)
RBC: 3.15 MIL/uL — ABNORMAL LOW (ref 3.80–5.20)
RDW: 13.5 % (ref 11.5–14.5)
WBC: 10.9 10*3/uL (ref 3.6–11.0)

## 2016-10-15 LAB — FETAL SCREEN: Fetal Screen: NEGATIVE

## 2016-10-15 MED ORDER — IBUPROFEN 800 MG PO TABS: 800.0000 mg | ORAL_TABLET | Freq: Three times a day (TID) | ORAL | 1 refills | Status: DC | PRN

## 2016-10-15 MED ORDER — CEFDINIR 300 MG PO CAPS
300.0000 mg | ORAL_CAPSULE | Freq: Two times a day (BID) | ORAL | Status: DC
  Administered 2016-10-15 – 2016-10-16 (×2): 300 mg via ORAL
  Filled 2016-10-15: qty 1

## 2016-10-15 MED ORDER — FERROUS SULFATE 325 (65 FE) MG PO TABS: 325.0000 mg | ORAL_TABLET | Freq: Every day | ORAL | 0 refills | Status: DC

## 2016-10-15 MED ORDER — RHO D IMMUNE GLOBULIN 1500 UNIT/2ML IJ SOSY
300.0000 ug | PREFILLED_SYRINGE | Freq: Once | INTRAMUSCULAR | Status: AC
  Administered 2016-10-15: 300 ug via INTRAMUSCULAR
  Filled 2016-10-15: qty 2

## 2016-10-15 MED ORDER — DOCUSATE SODIUM 100 MG PO CAPS: 100.0000 mg | ORAL_CAPSULE | Freq: Two times a day (BID) | ORAL | 0 refills | Status: DC

## 2016-10-15 NOTE — Discharge Instructions (Signed)

## 2016-10-15 NOTE — Progress Notes (Signed)
Post Partum Day # 1, s/p SVD  Subjective: no complaints, up ad lib, voiding and tolerating PO. Still notes some swelling of vulva but perineum is better.   Objective: Temp:  [97.9 F (36.6 C)-98.6 F (37 C)] 97.9 F (36.6 C) (12/02 0426) Pulse Rate:  [86-120] 86 (12/02 0801) Resp:  [14-20] 14 (12/02 0801) BP: (91-123)/(47-78) 91/54 (12/02 0801) SpO2:  [98 %-100 %] 100 % (12/02 0801)  Physical Exam:  General: alert and no distress  Lungs: clear to auscultation bilaterally Breasts: normal appearance, no masses or tenderness Heart: regular rate and rhythm, S1, S2 normal, no murmur, click, rub or gallop Pelvis: Lochia: appropriate, Uterine Fundus: firm Extremities: DVT Evaluation: No evidence of DVT seen on physical exam. Negative Homan's sign. No cords or calf tenderness. No significant calf/ankle edema.   Recent Labs  10/13/16 1806 10/15/16 0541  HGB 12.9 9.7*  HCT 38.3 28.0*    Assessment/Plan: Plan for discharge tomorrow, Breastfeeding, Lactation consult and Contraception undecided.  Initially desired BTL, but only if she required C/S delivery. Briefly discussed options. Can discuss more at postpartum visit.  Anemia postpartum, mild and asymptomatic. Will treat with PO iron supplementation.    LOS: 2 days   Hildred LaserAnika Emmerson Taddei [facility]

## 2016-10-16 LAB — RHOGAM INJECTION: Unit division: 0

## 2016-10-16 MED ORDER — MAGNESIUM HYDROXIDE 400 MG/5ML PO SUSP
30.0000 mL | Freq: Every day | ORAL | Status: DC | PRN
  Administered 2016-10-16: 30 mL via ORAL
  Filled 2016-10-16: qty 30

## 2016-10-16 MED ORDER — FLEET ENEMA 7-19 GM/118ML RE ENEM: 1.0000 | ENEMA | Freq: Once | RECTAL | Status: DC

## 2016-10-16 NOTE — Progress Notes (Signed)
Discharge instructions reviewed with patient.  All questions answered.

## 2016-10-16 NOTE — Progress Notes (Addendum)
Post Partum Day # 2, s/p SVD  Subjective: up ad lib, voiding and tolerating PO. Complains of constipation, notes having to employ manual extraction this morning.  Is taking Colace, but does not appear to be helping.   Objective: Vitals:   10/15/16 1216 10/15/16 1632 10/15/16 2001 10/16/16 0737  BP: 123/81 93/80 112/69 109/60  Pulse: (!) 102 94 90 79  Resp: 16 16 18 14   Temp: 98.1 F (36.7 C) 98 F (36.7 C) 97.9 F (36.6 C) 97.8 F (36.6 C)  TempSrc: Oral Oral Oral Oral  SpO2:  96% 99% 100%  Weight:      Height:       Physical Exam:  General: alert and no distress  Lungs: clear to auscultation bilaterally Breasts: normal appearance, no masses or tenderness Heart: regular rate and rhythm, S1, S2 normal, no murmur, click, rub or gallop Pelvis: Lochia: appropriate, Uterine Fundus: firm Extremities: DVT Evaluation: No evidence of DVT seen on physical exam. Negative Homan's sign. No cords or calf tenderness. No significant calf/ankle edema.   Recent Labs  10/13/16 1806 10/15/16 0541  HGB 12.9 9.7*  HCT 38.3 28.0*    Assessment/Plan: Discharge home, Breastfeeding and Contraception undecided.  Initially desired BTL, but only if she required C/S.  Will include contraception options in discharge papers.   Anemia postpartum, mild and asymptomatic. Will treat with PO iron supplementation.  Will give Fleets enema or milk of magnesium (pt preference) for constipation prior to discharge.    LOS: 3 days   Erin LaserAnika Timber Gomez [facility]

## 2016-10-16 NOTE — Discharge Summary (Signed)
Obstetric Discharge Summary Reason for Admission: onset of labor Prenatal Procedures: ultrasound Intrapartum Procedures: spontaneous vaginal delivery and GBS prophylaxis Postpartum Procedures: Rho(D) Ig Complications-Operative and Postpartum: vaginal laceration Hemoglobin  Date Value Ref Range Status  10/15/2016 9.7 (L) 12.0 - 16.0 g/dL Final    Comment:    RESULT REPEATED AND VERIFIED   HCT  Date Value Ref Range Status  10/15/2016 28.0 (L) 35.0 - 47.0 % Final   Hematocrit  Date Value Ref Range Status  07/13/2016 34.9 34.0 - 46.6 % Final    Physical Exam:  Blood pressure 109/60, pulse 79, temperature 97.8 F (36.6 C), temperature source Oral, resp. rate 14, height 5\' 4"  (1.626 m), weight 154 lb (69.9 kg), last menstrual period 01/04/2016, SpO2 100 %, unknown if currently breastfeeding.  General: alert and no distress Lochia: appropriate Uterine Fundus: firm Incision: none DVT Evaluation: No evidence of DVT seen on physical exam. Negative Homan's sign. No cords or calf tenderness. No significant calf/ankle edema.   Discharge Diagnoses: Post-date pregnancy  Discharge Information: Date: 10/16/2016 Activity: pelvic rest Diet: routine Medications: PNV, Ibuprofen, Colace and Iron Condition: stable Instructions: refer to practice specific booklet Discharge to: home Follow-up Information    [name], CNM Follow up in 6 week(s).   Specialties:  Obstetrics and Gynecology, Radiology Why:  For postpartum visit Contact information: [address] [place] [address] [phone]           Newborn Data: Live born female  Birth Weight: 8 lb 12 oz (3970 g) APGAR: 8, 9  Home with mother.  Erin Gomez 10/16/2016, 11:20 AM

## 2016-10-16 NOTE — Progress Notes (Signed)
Pt. Transported downstairs in Wheel Chair by [facility]. D/C and D/C instructions by Sonda PrimesE. Quiroz RN. Mom is alert and oriented and denies c/o. Mother of Pt. Is with this Patient.

## 2016-10-18 ENCOUNTER — Encounter: Payer: BLUE CROSS/BLUE SHIELD | Admitting: Obstetrics and Gynecology

## 2016-10-20 ENCOUNTER — Encounter: Payer: Self-pay | Admitting: Obstetrics and Gynecology

## 2016-10-20 ENCOUNTER — Telehealth: Payer: Self-pay | Admitting: Obstetrics and Gynecology

## 2016-10-20 ENCOUNTER — Ambulatory Visit (INDEPENDENT_AMBULATORY_CARE_PROVIDER_SITE_OTHER): Payer: BLUE CROSS/BLUE SHIELD | Admitting: Obstetrics and Gynecology

## 2016-10-20 VITALS — BP 138/82 | HR 98 | Wt 145.1 lb

## 2016-10-20 DIAGNOSIS — S01512A Laceration without foreign body of oral cavity, initial encounter: Secondary | ICD-10-CM | POA: Diagnosis not present

## 2016-10-20 MED ORDER — HYDROCORTISONE ACE-PRAMOXINE 1-1 % RE FOAM: 1.0000 | Freq: Three times a day (TID) | RECTAL | 0 refills | Status: DC

## 2016-10-20 NOTE — Telephone Encounter (Signed)
PT SAID SHE HAS A VERTICAL TEAR IN HER VAGINA WITH BIRTH. sWELLING HAS GONE DONE ENOUGH O SEE IT AND WONDERED IF ANYTHING CAN BE DONE.

## 2016-10-20 NOTE — Progress Notes (Signed)
Subjective:     Patient ID: Erin Gomez, female   DOB: 12/28/1970, 45 y.o.   MRN: [id number]  HPI Delivered female infant 6 days ago vaginally, and has experienced excrutiating pain with voiding x 4 days. Looked at perineum with mirror and noted a flap of loose skin at clitorus and desires examination of it. Is currently breast feeding without diffculty.  Review of Systems See above    Objective:   Physical Exam A&O x4 Well groomed female Blood pressure 138/82, pulse 98, weight 145 lb 1.6 oz (65.8 kg), currently breastfeeding. Pelvic exam: VULVA: vulvar tenderness with open lesion on left labia minora extending to clitoral hood, VAGINA: normal appearing vagina with normal color and discharge, no lesions.  Procedure note: Tear cleaned with betadine after topical lidocaine gel applied. 2cc lidocaine with epi injected. Newly formed scar removed with scalple and edges re-approximated with 4.0 vycril suture without difficulty. Minimal bleeding and pain noted.    Assessment:     Left labial laceration not healing-re-approxiamated in office    Plan:     Proctofoam HCL order sent in & instructed on use.  Witch hazel soaked pads as needed. RTC as needed     Harlow MaresMelody Dane Bloch, CNM

## 2016-10-20 NOTE — Telephone Encounter (Signed)
Spoke with Erin C. About seeing this pt as Dr.Cherry had to leave for delivery, please see if pt can come around 2pm to be seen by [facility]. Please make pt aware that she is being worked in and may have alittle wait time. Thaks

## 2016-10-25 ENCOUNTER — Ambulatory Visit (INDEPENDENT_AMBULATORY_CARE_PROVIDER_SITE_OTHER): Payer: BLUE CROSS/BLUE SHIELD | Admitting: Obstetrics and Gynecology

## 2016-10-25 ENCOUNTER — Telehealth: Payer: Self-pay | Admitting: Obstetrics and Gynecology

## 2016-10-25 ENCOUNTER — Encounter: Payer: Self-pay | Admitting: Obstetrics and Gynecology

## 2016-10-25 VITALS — BP 118/75 | HR 90 | Ht 64.0 in | Wt 138.2 lb

## 2016-10-25 DIAGNOSIS — S01512S Laceration without foreign body of oral cavity, sequela: Secondary | ICD-10-CM | POA: Diagnosis not present

## 2016-10-25 DIAGNOSIS — S01512A Laceration without foreign body of oral cavity, initial encounter: Secondary | ICD-10-CM | POA: Insufficient documentation

## 2016-10-25 MED ORDER — LIDOCAINE 5 % EX OINT: 1.0000 "application " | TOPICAL_OINTMENT | CUTANEOUS | 0 refills | Status: DC | PRN

## 2016-10-25 NOTE — Telephone Encounter (Signed)
Erin CanesChristine said to give her a call when you get a minute

## 2016-10-25 NOTE — Telephone Encounter (Signed)
Pt aware of 35g tube erx. She states its 50.00 and wanted something smaller. Advised 30g tube was the smallest. May want to pharmacy shop. Will call back if she wants me to erx to another pharmacy.

## 2016-10-25 NOTE — Progress Notes (Signed)
Chief complaint: 1. The laceration postpartum  Patient presents for follow-up on labial laceration. Scar tissue was debrided and the labial laceration was sutured on 10/21/2016. Patient reports some persistent discomfort and burning with urination. She does have concerns regarding functionality of the labia minora and clitoral hood following healing. Patient is breast-feeding.  OBJECTIVE: BP 118/75   Pulse 90   Ht 5\' 4"  (1.626 m)   Wt 138 lb 3.2 oz (62.7 kg)   Breastfeeding? Yes   BMI 23.72 kg/m  Pleasant female in no acute distress. Pelvic exam: External genitalia-left labium minora laceration is reapproximated with suture-minimal separation is noted without significant drainage or discharge. No induration. Clitoral heart is normal. Right labia minora normal Vagina-no palpable abnormalities  ASSESSMENT: 1. Left labia minora laceration, symptomatic after vaginal delivery. Suture intact with slight separation noted  PLAN: 1. Recommend sitz bath twice a day; recommend blow drying peritoneum with a hair dryer after bathing 2. Recommend using Desitin ointment for protection against urine acidity after bathing 3. May use topical lidocaine gel for up to 4 times a day as needed for pain relief 4. Return in 1 week for follow-up 5. Would not recommend further surgical repair at this time.  \A total of 15 minutes were spent face-to-face with the patient during this encounter and over half of that time dealt with counseling and coordination of care.  Herold HarmsMartin A Defrancesco, MD  Note: This dictation was prepared with Dragon dictation along with smaller phrase technology. Any transcriptional errors that result from this process are unintentional.

## 2016-10-25 NOTE — Patient Instructions (Signed)
1. Sitz bath twice a day; uses hair dryer to blow dry perineum after sitz bath 2. Desitin ointment after sitz bath for protection from urine acidity 3. Lidocaine gel topically as needed for times a day for symptomatic relief 4. Return in 1 week for follow-up

## 2016-11-02 ENCOUNTER — Encounter: Payer: Self-pay | Admitting: Obstetrics and Gynecology

## 2016-11-02 ENCOUNTER — Ambulatory Visit (INDEPENDENT_AMBULATORY_CARE_PROVIDER_SITE_OTHER): Payer: BLUE CROSS/BLUE SHIELD | Admitting: Obstetrics and Gynecology

## 2016-11-02 ENCOUNTER — Encounter: Payer: BLUE CROSS/BLUE SHIELD | Admitting: Obstetrics and Gynecology

## 2016-11-02 VITALS — BP 90/54 | HR 87 | Ht 64.0 in | Wt 138.2 lb

## 2016-11-02 DIAGNOSIS — S01512S Laceration without foreign body of oral cavity, sequela: Secondary | ICD-10-CM

## 2016-11-02 DIAGNOSIS — S322XXA Fracture of coccyx, initial encounter for closed fracture: Secondary | ICD-10-CM | POA: Diagnosis not present

## 2016-11-02 DIAGNOSIS — S3210XA Unspecified fracture of sacrum, initial encounter for closed fracture: Secondary | ICD-10-CM

## 2016-11-02 DIAGNOSIS — R599 Enlarged lymph nodes, unspecified: Secondary | ICD-10-CM | POA: Diagnosis not present

## 2016-11-02 MED ORDER — IBUPROFEN 800 MG PO TABS: 800.0000 mg | ORAL_TABLET | Freq: Three times a day (TID) | ORAL | 1 refills | Status: DC | PRN

## 2016-11-02 NOTE — Progress Notes (Signed)
Chief complaint: 1. Vulvar laceration 2. Right axillary lump 3. Fractured tailbone  Patient presents for follow-up. She was previously seen for evaluation of left labia minora laceration sustained during childbirth. She is still having discomfort but less than previously noted. Patient is complaining of tailbone pain. She felt a "crack" at childbirth and recently noted the same cracking sensation in her sacrum and coccyx region. Her tailbone is exquisitely sore. Patient also noted a lump in her right axilla over the past several days. She denies fevers chills or sweats. She denies breast pain, redness of breast, skin laceration or nipple laceration. Baby is nursing well.  OBJECTIVE: BP (!) 90/54   Pulse 87   Ht 5\' 4"  (1.626 m)   Wt 138 lb 4 oz (62.7 kg)   BMI 23.73 kg/m  Pleasant well-appearing female in no acute distress Neck: Soft, nontender without lymphadenopathy. No supraclavicular lymphadenopathy noted Breasts: Lactation changes bilaterally skin and nipples appear healthy. No rash, no redness, no lumps. Right axilla notable for 1 x 2 cm visible and mobile nodule, likely consistent with a lymph node. No other palpable abnormalities are noted in the right or left axilla. Pelvic: External genitalia-healing left labial laceration (labia minora). No redness. No induration. No drainage BUS-normal Vagina-decreased estrogen effect: No palpable abnormalities  ASSESSMENT: 1. Vulvar laceration, healing 2. Right axillary lymph node 1 x 2 cm, visible, mobile, minimally tender without other abnormality identified on clinical exam 3. Suspected fractured coccyx from childbirth  PLAN: 1. Continue vulvar perineal hygiene as previously written 2. Ultrasound of right axilla and breast to assess isolated lymphadenopathy 3. Doughnut to be used for symptomatic relief while sitting. 4. Continue with ibuprofen and Tylenol for pain relief  A total of 15 minutes were spent face-to-face with the patient  during this encounter and over half of that time dealt with counseling and coordination of care.  Herold HarmsMartin A Vina Byrd, MD  Note: This dictation was prepared with Dragon dictation along with smaller phrase technology. Any transcriptional errors that result from this process are unintentional.

## 2016-11-02 NOTE — Patient Instructions (Signed)
1. Continue local perineal hygiene as previously written 2. Ultrasound of right axilla and breast is ordered 3. Continue nursing and pumping her breasts 4. Return for 6 week postpartum follow-up as scheduled with Melody Shambley 5. Continue taking ibuprofen and Tylenol for pain relief

## 2016-11-09 ENCOUNTER — Other Ambulatory Visit: Payer: Self-pay | Admitting: Obstetrics and Gynecology

## 2016-11-24 ENCOUNTER — Encounter: Payer: Self-pay | Admitting: Obstetrics and Gynecology

## 2016-11-24 ENCOUNTER — Ambulatory Visit (INDEPENDENT_AMBULATORY_CARE_PROVIDER_SITE_OTHER): Payer: BLUE CROSS/BLUE SHIELD | Admitting: Obstetrics and Gynecology

## 2016-11-24 DIAGNOSIS — N912 Amenorrhea, unspecified: Secondary | ICD-10-CM

## 2016-11-24 MED ORDER — NORETHINDRONE 0.35 MG PO TABS: 1.0000 | ORAL_TABLET | Freq: Every day | ORAL | 11 refills | Status: DC

## 2016-11-24 NOTE — Patient Instructions (Signed)
  Place postpartum visit patient instructions here.  

## 2016-11-24 NOTE — Progress Notes (Signed)
   Subjective:     Erin Gomez is a 46 y.o. female who presents for a postpartum visit. She is 6 weeks postpartum following a spontaneous vaginal delivery. I have fully reviewed the prenatal and intrapartum course. The delivery was at 41 gestational weeks. Outcome: spontaneous vaginal delivery. Anesthesia: none. Postpartum course has been complicated by slow healing vulvar tear. Baby's course has been uncomplicated. Baby is feeding by breast. Bleeding no bleeding. Bowel function is normal. Bladder function is normal. Patient is not sexually active. Contraception method is abstinence. Postpartum depression screening: negative.  The following portions of the patient's history were reviewed and updated as appropriate: allergies, current medications, past family history, past medical history, past social history, past surgical history and problem list.  Review of Systems Pertinent items noted in HPI and remainder of comprehensive ROS otherwise negative.   Objective:    BP 102/68   Pulse 98   Ht 5' 4.5" (1.638 m)   Wt 143 lb 11.2 oz (65.2 kg)   Breastfeeding? Yes   BMI 24.29 kg/m   General:  alert, cooperative and appears stated age   Breasts:  inspection negative, no nipple discharge or bleeding, no masses or nodularity palpable  Lungs: clear to auscultation bilaterally  Heart:  regular rate and rhythm, S1, S2 normal, no murmur, click, rub or gallop  Abdomen: soft, non-tender; bowel sounds normal; no masses,  no organomegaly   Vulva:  normal and left labia laceration stitch came out and not completely approxiamated, pale and dry  Vagina: normal vagina, no discharge, exudate, lesion, or erythema  Cervix:  multiparous appearance  Corpus: normal size, contour, position, consistency, mobility, non-tender  Adnexa:  normal adnexa and no mass, fullness, tenderness  Rectal Exam: Normal rectovaginal exam        Assessment:     6 eeks postpartum exam. Pap smear not done at today's visit.   Vulvar atrophy Plan:    1. Contraception: oral progesterone-only contraceptive 2. Estrogen cream to vulva nightly x 1 month 3. Follow up in: 4 months or as needed.

## 2016-11-25 LAB — CBC
Hematocrit: 38.9 % (ref 34.0–46.6)
Hemoglobin: 12.4 g/dL (ref 11.1–15.9)
MCH: 28.3 pg (ref 26.6–33.0)
MCHC: 31.9 g/dL (ref 31.5–35.7)
MCV: 89 fL (ref 79–97)
Platelets: 249 10*3/uL (ref 150–379)
RBC: 4.38 x10E6/uL (ref 3.77–5.28)
RDW: 13.1 % (ref 12.3–15.4)
WBC: 5.8 10*3/uL (ref 3.4–10.8)

## 2016-11-25 LAB — IRON: IRON: 83 ug/dL (ref 27–159)

## 2016-11-25 LAB — VITAMIN D 25 HYDROXY (VIT D DEFICIENCY, FRACTURES): VIT D 25 HYDROXY: 35.9 ng/mL (ref 30.0–100.0)

## 2016-12-20 ENCOUNTER — Other Ambulatory Visit: Payer: Self-pay | Admitting: Obstetrics and Gynecology

## 2016-12-21 ENCOUNTER — Other Ambulatory Visit: Payer: Self-pay | Admitting: Obstetrics and Gynecology

## 2016-12-30 ENCOUNTER — Telehealth: Payer: Self-pay | Admitting: Obstetrics and Gynecology

## 2016-12-30 NOTE — Telephone Encounter (Signed)
Patient states she has a question for you. You can reach her at [phone]

## 2017-01-02 NOTE — Telephone Encounter (Signed)
Called pt left message to call me back

## 2017-02-18 ENCOUNTER — Other Ambulatory Visit: Payer: Self-pay | Admitting: Obstetrics and Gynecology

## 2017-03-23 ENCOUNTER — Ambulatory Visit (INDEPENDENT_AMBULATORY_CARE_PROVIDER_SITE_OTHER): Payer: BLUE CROSS/BLUE SHIELD | Admitting: Obstetrics and Gynecology

## 2017-03-23 ENCOUNTER — Other Ambulatory Visit: Payer: Self-pay | Admitting: Obstetrics and Gynecology

## 2017-03-23 ENCOUNTER — Encounter: Payer: Self-pay | Admitting: Obstetrics and Gynecology

## 2017-03-23 VITALS — BP 119/72 | HR 87 | Ht 64.5 in | Wt 144.1 lb

## 2017-03-23 DIAGNOSIS — Z01419 Encounter for gynecological examination (general) (routine) without abnormal findings: Secondary | ICD-10-CM

## 2017-03-23 DIAGNOSIS — R5383 Other fatigue: Secondary | ICD-10-CM

## 2017-03-23 DIAGNOSIS — N9419 Other specified dyspareunia: Secondary | ICD-10-CM | POA: Diagnosis not present

## 2017-03-23 DIAGNOSIS — N912 Amenorrhea, unspecified: Secondary | ICD-10-CM | POA: Diagnosis not present

## 2017-03-23 MED ORDER — LIDOCAINE HCL 2 % EX GEL: 1.0000 "application " | CUTANEOUS | 2 refills | Status: DC | PRN

## 2017-03-23 NOTE — Progress Notes (Signed)
Subjective:   Erin Gomez is a 46 y.o. [phone] Caucasian female here for a routine well-woman exam.  No LMP recorded.    Current complaints: pain with sex at tear site, not making enough milk, fatigue and very stressed. PCP: me       Does  desire labs  Social History: Sexual: heterosexual Marital Status: married Living situation: with family Occupation: FT singer in band, and PT yoga instructor Tobacco/alcohol: no tobacco use Illicit drugs: no history of illicit drug use  The following portions of the patient's history were reviewed and updated as appropriate: allergies, current medications, past family history, past medical history, past social history, past surgical history and problem list.  Past Medical History Past Medical History:  Diagnosis Date  . History of ITP 10/14/2016  . History of miscarriage, currently pregnant   . Vaginal Pap smear, abnormal     Past Surgical History Past Surgical History:  Procedure Laterality Date  . LEEP      Gynecologic History [id number]  No LMP recorded. Contraception: oral progesterone-only contraceptive Last Pap: 2016. Results were: normal   Obstetric History OB History  Gravida Para Term Preterm AB Living  9 1 1   8 1   SAB TAB Ectopic Multiple Live Births  8     0 1    # Outcome Date GA Lbr Len/2nd Weight Sex Delivery Anes PTL Lv  9 Term 10/14/16 [id number] / 02:06 8 lb 12 oz (3.97 kg) F Vag-Spont None  LIV  8 SAB           7 SAB           6 SAB           5 SAB           4 SAB           3 SAB           2 SAB           1 SAB               Current Medications Current Outpatient Prescriptions on File Prior to Visit  Medication Sig Dispense Refill  . ferrous sulfate 325 (65 FE) MG tablet TAKE 1 TABLET (325 MG TOTAL) BY MOUTH DAILY WITH BREAKFAST. 60 tablet 0  . folic acid (FOLVITE) 1 MG tablet TAKE 1 TABLET (1 MG TOTAL) BY MOUTH DAILY. 90 tablet 1  . ibuprofen (ADVIL,MOTRIN) 800 MG tablet TAKE 1 TABLET (800 MG TOTAL) BY  MOUTH EVERY 8 (EIGHT) HOURS AS NEEDED. 60 tablet 1  . norethindrone (MICRONOR,CAMILA,ERRIN) 0.35 MG tablet Take 1 tablet (0.35 mg total) by mouth daily. 1 Package 11  . Prenatal Vit-Fe Fumarate-FA (PRENATAL MULTIVITAMIN) TABS tablet Take 1 tablet by mouth daily at 12 noon.     No current facility-administered medications on file prior to visit.     Review of Systems Patient denies any headaches, blurred vision, shortness of breath, chest pain, abdominal pain, problems with bowel movements, urination, or intercourse.  Objective:  BP 119/72   Pulse 87   Ht 5' 4.5" (1.638 m)   Wt 144 lb 1.6 oz (65.4 kg)   Breastfeeding? Yes   BMI 24.35 kg/m  Physical Exam  General:  Well developed, well nourished, no acute distress. She is alert and oriented x3. Skin:  Warm and dry Neck:  Midline trachea, no thyromegaly or nodules Cardiovascular: Regular rate and rhythm, no murmur heard Lungs:  Effort normal, all lung fields clear  to auscultation bilaterally Breasts:  No dominant palpable mass, retraction, or nipple discharge Abdomen:  Soft, non tender, no hepatosplenomegaly or masses Pelvic:  External genitalia is normal in appearance.  The vagina is normal in appearance. The cervix is bulbous, no CMT.  Thin prep pap is done with HR HPV cotesting. Uterus is felt to be normal size, shape, and contour.  No adnexal masses or tenderness noted.mild cystocele noted.  Extremities:  No swelling or varicosities noted Psych:  She has a normal mood and affect  Assessment:   Healthy well-woman exam Lactational amenorrhea Dyspareunia due to labial tear Fatigue   Plan:  Labs obtained, will follow up accordingly Recommended ceasing mini-pill and use condoms until spouses vasectomy to see if mood and energy improves as she is very sensitive to hormones. Offered RX for lidocaine gel to apply to left labia before sex. Discussed reconstructive surgery to repair labia or botox injections as they have been effective  in the past. She will consider. F/U 1 year for AE, or sooner if needed   Sadae Arrazola Suzan Nailer, CNM

## 2017-03-24 LAB — LIPID PANEL
CHOLESTEROL TOTAL: 191 mg/dL (ref 100–199)
Chol/HDL Ratio: 3 ratio (ref 0.0–4.4)
HDL: 63 mg/dL (ref >39)
LDL Calculated: 99 mg/dL (ref 0–99)
Triglycerides: 146 mg/dL (ref 0–149)
VLDL CHOLESTEROL CAL: 29 mg/dL (ref 5–40)

## 2017-03-24 LAB — CBC
Hematocrit: 38.1 % (ref 34.0–46.6)
Hemoglobin: 12.8 g/dL (ref 11.1–15.9)
MCH: 29.4 pg (ref 26.6–33.0)
MCHC: 33.6 g/dL (ref 31.5–35.7)
MCV: 88 fL (ref 79–97)
PLATELETS: 223 10*3/uL (ref 150–379)
RBC: 4.35 x10E6/uL (ref 3.77–5.28)
RDW: 13.9 % (ref 12.3–15.4)
WBC: 5.1 10*3/uL (ref 3.4–10.8)

## 2017-03-24 LAB — COMPREHENSIVE METABOLIC PANEL
A/G RATIO: 1.9 (ref 1.2–2.2)
ALT: 13 IU/L (ref 0–32)
AST: 15 IU/L (ref 0–40)
Albumin: 4.4 g/dL (ref 3.5–5.5)
Alkaline Phosphatase: 57 IU/L (ref 39–117)
BILIRUBIN TOTAL: 0.3 mg/dL (ref 0.0–1.2)
BUN/Creatinine Ratio: 23 (ref 9–23)
BUN: 15 mg/dL (ref 6–24)
CALCIUM: 9.2 mg/dL (ref 8.7–10.2)
CHLORIDE: 104 mmol/L (ref 96–106)
CO2: 25 mmol/L (ref 18–29)
Creatinine, Ser: 0.66 mg/dL (ref 0.57–1.00)
GFR calc non Af Amer: 106 mL/min/{1.73_m2} (ref >59)
GFR, EST AFRICAN AMERICAN: 123 mL/min/{1.73_m2} (ref >59)
GLUCOSE: 133 mg/dL — AB (ref 65–99)
Globulin, Total: 2.3 g/dL (ref 1.5–4.5)
POTASSIUM: 4.3 mmol/L (ref 3.5–5.2)
Sodium: 144 mmol/L (ref 134–144)
TOTAL PROTEIN: 6.7 g/dL (ref 6.0–8.5)

## 2017-03-24 LAB — THYROID PANEL WITH TSH
Free Thyroxine Index: 1.7 (ref 1.2–4.9)
T3 UPTAKE RATIO: 28 % (ref 24–39)
T4 TOTAL: 6.2 ug/dL (ref 4.5–12.0)
TSH: 1.32 u[IU]/mL (ref 0.450–4.500)

## 2017-03-24 LAB — CYTOLOGY - PAP

## 2017-04-20 ENCOUNTER — Other Ambulatory Visit: Payer: Self-pay | Admitting: Obstetrics and Gynecology

## 2017-05-10 ENCOUNTER — Other Ambulatory Visit: Payer: Self-pay | Admitting: Obstetrics and Gynecology

## 2017-05-23 ENCOUNTER — Other Ambulatory Visit: Payer: Self-pay | Admitting: Obstetrics and Gynecology

## 2017-05-23 ENCOUNTER — Telehealth: Payer: Self-pay | Admitting: *Deleted

## 2017-05-23 DIAGNOSIS — R002 Palpitations: Secondary | ICD-10-CM

## 2017-05-23 DIAGNOSIS — R0602 Shortness of breath: Secondary | ICD-10-CM

## 2017-05-23 NOTE — Telephone Encounter (Signed)
Pt would like referral to cardiology having some SOB, prefers Thursday appointment

## 2017-07-03 ENCOUNTER — Telehealth: Payer: Self-pay

## 2017-07-03 NOTE — Telephone Encounter (Signed)
Patient called saying that she needs a PCP. She just had a baby a few months ago. Her husband Azrielle Centner is currently a patient here. Would you be willing to accept? Please advise. Thanks!

## 2017-07-03 NOTE — Telephone Encounter (Signed)
Of course. Thanks!  Erasmo Downer, MD, MPH [facility] 07/03/2017 2:07 PM

## 2017-07-04 NOTE — Telephone Encounter (Signed)
Left message for pt to call me back/MW °

## 2017-07-04 NOTE — Telephone Encounter (Signed)
Can you schedule this patient a new patient appt? Thanks!

## 2017-07-07 ENCOUNTER — Encounter: Payer: Self-pay | Admitting: Family Medicine

## 2017-07-07 ENCOUNTER — Ambulatory Visit (INDEPENDENT_AMBULATORY_CARE_PROVIDER_SITE_OTHER): Payer: BLUE CROSS/BLUE SHIELD | Admitting: Family Medicine

## 2017-07-07 VITALS — BP 102/58 | HR 72 | Resp 16 | Ht 64.75 in | Wt 145.0 lb

## 2017-07-07 DIAGNOSIS — R0602 Shortness of breath: Secondary | ICD-10-CM

## 2017-07-07 DIAGNOSIS — R5383 Other fatigue: Secondary | ICD-10-CM | POA: Diagnosis not present

## 2017-07-07 DIAGNOSIS — G629 Polyneuropathy, unspecified: Secondary | ICD-10-CM | POA: Insufficient documentation

## 2017-07-07 DIAGNOSIS — Z7689 Persons encountering health services in other specified circumstances: Secondary | ICD-10-CM

## 2017-07-07 DIAGNOSIS — R079 Chest pain, unspecified: Secondary | ICD-10-CM | POA: Diagnosis not present

## 2017-07-07 NOTE — Assessment & Plan Note (Signed)
Nonspecific Check CBC, CMP, TSH, Vit D, Vit B12 Consider PHQ9, GAD7 at next visit Encouraged resuming yoga

## 2017-07-07 NOTE — Progress Notes (Signed)
Patient: Erin Gomez Female    DOB: 11-19-1970   46 y.o.   MRN: [id number] Visit Date: 07/07/2017  Today's Provider: Shirlee Latch, MD   Chief Complaint  Patient presents with  . Establish Care  . Numbness  . Chest Pain   Subjective:     Mrs. Poe presents to establish care. She is active, she is a part-time Marine scientist, and is a full time singer in a band. She also states she a healthy diet. She is here with complaints of numbness after having a baby in December. The numbness is located in her feet and fingers. She has been checking her sugars at home, and they read in 90's (fasting ) and postprandial sugar is in the 140's. She reports she "dropped a chair" on her foot while she was pregnant, and she is concerned that the numbness could be coming form that injury. She is also c/o body aches and chest pain.  Numbness has been worse in the last 2 months. It is intermittent and has pins and needles feeling when she wakes up in the mornings.  It gets better through the day.   Chest Pain   This is a new problem. The current episode started more than 1 month ago (x 3 months). The onset quality is gradual. The problem occurs constantly. The problem has been unchanged. Pain location: left side of sternum over left breast. The pain is at a severity of 3/10. The pain is mild. The quality of the pain is described as pressure. Radiates to: unknown. Pt reports she has a H/O neck pain. Associated symptoms include back pain, diaphoresis (night sweats), headaches, irregular heartbeat, lower extremity edema, malaise/fatigue, numbness, palpitations and shortness of breath (x 2 years per pt. Steroids has helped this in the past). Pertinent negatives include no abdominal pain, claudication, cough, dizziness, exertional chest pressure, fever, hemoptysis, nausea, near-syncope, orthopnea (occasionally), syncope, vomiting or weakness. The pain is aggravated by nothing. She has tried nothing for the  symptoms. Risk factors include stress (high cholesterol per pt).  Her family medical history is significant for heart disease (possibly in mother).  Pt has a H/O anemia, and is not currently taking iron.  Her midwife told her to see a cardiologist, so she has an appt scheduled in October.  Never experienced pain like this before.  Wonders if her SOB may be from not doing cardio in the last 3-4 years.  Has h/o costochondritis, but this feels different.   Takes ibuprofen for chronic inflammation since having baby.  Previously yoga has helped with neck/back/knee/hip pain.  She reports "I'm Very proprioceptive with my body."       Allergies  Allergen Reactions  . Adhesive [Tape] Rash     Current Outpatient Prescriptions:  .  acyclovir (ZOVIRAX) 400 MG tablet, TAKE 1 TABLET (400 MG TOTAL) BY MOUTH 2 (TWO) TIMES DAILY., Disp: 60 tablet, Rfl: 2 .  ibuprofen (ADVIL,MOTRIN) 800 MG tablet, TAKE 1 TABLET (800 MG TOTAL) BY MOUTH EVERY 8 (EIGHT) HOURS AS NEEDED., Disp: 60 tablet, Rfl: 1 .  lidocaine (XYLOCAINE) 2 % jelly, Apply 1 application topically as needed., Disp: 30 mL, Rfl: 2 .  ferrous sulfate 325 (65 FE) MG tablet, TAKE 1 TABLET (325 MG TOTAL) BY MOUTH DAILY WITH BREAKFAST. (Patient not taking: Reported on 07/07/2017), Disp: 60 tablet, Rfl: 0 .  folic acid (FOLVITE) 1 MG tablet, TAKE 1 TABLET (1 MG TOTAL) BY MOUTH DAILY. (Patient not taking: Reported on 07/07/2017), Disp: 90  tablet, Rfl: 1 .  Prenatal Vit-Fe Fumarate-FA (PRENATAL MULTIVITAMIN) TABS tablet, Take 1 tablet by mouth daily at 12 noon., Disp: , Rfl:   Review of Systems  Constitutional: Positive for diaphoresis (night sweats), fatigue and malaise/fatigue. Negative for chills and fever.  HENT: Negative.   Eyes: Negative.   Respiratory: Positive for shortness of breath (x 2 years per pt. Steroids has helped this in the past). Negative for cough and hemoptysis.   Cardiovascular: Positive for chest pain, palpitations and leg swelling.  Negative for orthopnea (occasionally), claudication, syncope and near-syncope.  Gastrointestinal: Negative for abdominal pain, nausea and vomiting.  Endocrine: Negative.   Genitourinary: Negative.   Musculoskeletal: Positive for arthralgias, back pain, joint swelling, myalgias, neck pain and neck stiffness.  Neurological: Positive for numbness and headaches. Negative for dizziness and weakness.  Psychiatric/Behavioral: Negative.     Social History  Substance Use Topics  . Smoking status: Never Smoker  . Smokeless tobacco: Never Used  . Alcohol use No   Objective:   BP (!) 102/58 (BP Location: Left Arm, Patient Position: Sitting, Cuff Size: Normal)   Pulse 72   Resp 16   Ht 5' 4.75" (1.645 m)   Wt 145 lb (65.8 kg)   LMP 07/03/2017   SpO2 96%   BMI 24.32 kg/m  Vitals:   07/07/17 0919  BP: (!) 102/58  Pulse: 72  Resp: 16  SpO2: 96%  Weight: 145 lb (65.8 kg)  Height: 5' 4.75" (1.645 m)     Physical Exam  Constitutional: She is oriented to person, place, and time. She appears well-developed and well-nourished. No distress.  HENT:  Head: Normocephalic and atraumatic.  Right Ear: External ear normal.  Left Ear: External ear normal.  Nose: Nose normal.  Mouth/Throat: Oropharynx is clear and moist.  Eyes: Conjunctivae and EOM are normal. No scleral icterus.  Neck: Neck supple. No JVD present. No thyromegaly present.  Cardiovascular: Normal rate, regular rhythm, normal heart sounds and intact distal pulses.   No murmur heard. Pulmonary/Chest: Effort normal and breath sounds normal. No respiratory distress. She has no wheezes. She has no rales. She exhibits no tenderness.  Abdominal: Soft. Bowel sounds are normal. She exhibits no distension. There is no tenderness. There is no rebound and no guarding.  Musculoskeletal: She exhibits no edema or deformity.  Lymphadenopathy:    She has no cervical adenopathy.  Neurological: She is alert and oriented to person, place, and time.  No cranial nerve deficit.  Decreased sensation to light touch over b/l toes and fingers  Skin: Skin is warm and dry. No rash noted.  Psychiatric: She has a normal mood and affect. Her behavior is normal.  Vitals reviewed.  EKG: NSR, mild bradycardia, no ischemic changes    Assessment & Plan:         Peripheral neuropathy Possible electrolyte/vitamin deficiency in the setting of recent pregnancy Otherwise neuro intact Check TSH, Vit B12, CMP Recently with neg HIV, RPR during pregnancy  Shortness of breath Unclear etiology Patient breathing comfortably, but states she is currently dyspneic Lung exam clear Check CBC as anemia could cause this No signs of VTE on exam Return precautions discussed   Fatigue Nonspecific Check CBC, CMP, TSH, Vit D, Vit B12 Consider PHQ9, GAD7 at next visit Encouraged resuming yoga  Chest pain Nonspecific Features make cardiac etiology unlikely Patient already has cardiology appt set up EKG NSR with mild bradycardia Exam benign Return precautions discussed Labs as above  F/u in 1 month on generalized  pain/inflammation  The entirety of the information documented in the History of Present Illness, Review of Systems and Physical Exam were personally obtained by me. Portions of this information were initially documented by Irving Burton Ratchford, CMA and reviewed by me for thoroughness and accuracy.    Shirlee Latch, MD  [facility]

## 2017-07-07 NOTE — Assessment & Plan Note (Signed)
Nonspecific Features make cardiac etiology unlikely Patient already has cardiology appt set up EKG NSR with mild bradycardia Exam benign Return precautions discussed Labs as above

## 2017-07-07 NOTE — Patient Instructions (Signed)
Nonspecific Chest Pain °Chest pain can be caused by many different conditions. There is always a chance that your pain could be related to something serious, such as a heart attack or a blood clot in your lungs. Chest pain can also be caused by conditions that are not life-threatening. If you have chest pain, it is very important to follow up with your health care provider. °What are the causes? °Causes of this condition include: °· Heartburn. °· Pneumonia or bronchitis. °· Anxiety or stress. °· Inflammation around your heart (pericarditis) or lung (pleuritis or pleurisy). °· A blood clot in your lung. °· A collapsed lung (pneumothorax). This can develop suddenly on its own (spontaneous pneumothorax) or from trauma to the chest. °· Shingles infection (varicella-zoster virus). °· Heart attack. °· Damage to the bones, muscles, and cartilage that make up your chest wall. This can include: °? Bruised bones due to injury. °? Strained muscles or cartilage due to frequent or repeated coughing or overwork. °? Fracture to one or more ribs. °? Sore cartilage due to inflammation (costochondritis). ° °What increases the risk? °Risk factors for this condition may include: °· Activities that increase your risk for trauma or injury to your chest. °· Respiratory infections or conditions that cause frequent coughing. °· Medical conditions or overeating that can cause heartburn. °· Heart disease or family history of heart disease. °· Conditions or health behaviors that increase your risk of developing a blood clot. °· Having had chicken pox (varicella zoster). ° °What are the signs or symptoms? °Chest pain can feel like: °· Burning or tingling on the surface of your chest or deep in your chest. °· Crushing, pressure, aching, or squeezing pain. °· Dull or sharp pain that is worse when you move, cough, or take a deep breath. °· Pain that is also felt in your back, neck, shoulder, or arm, or pain that spreads to any of these  areas. ° °Your chest pain may come and go, or it may stay constant. °How is this diagnosed? °Lab tests or other studies may be needed to find the cause of your pain. Your health care provider may have you take a test called an ECG (electrocardiogram). An ECG records your heartbeat patterns at the time the test is performed. You may also have other tests, such as: °· Transthoracic echocardiogram (TTE). In this test, sound waves are used to create a picture of the heart structures and to look at how blood flows through your heart. °· Transesophageal echocardiogram (TEE). This is a more advanced imaging test that takes images from inside your body. It allows your health care provider to see your heart in finer detail. °· Cardiac monitoring. This allows your health care provider to monitor your heart rate and rhythm in real time. °· Holter monitor. This is a portable device that records your heartbeat and can help to diagnose abnormal heartbeats. It allows your health care provider to track your heart activity for several days, if needed. °· Stress tests. These can be done through exercise or by taking medicine that makes your heart beat more quickly. °· Blood tests. °· Other imaging tests. ° °How is this treated? °Treatment depends on what is causing your chest pain. Treatment may include: °· Medicines. These may include: °? Acid blockers for heartburn. °? Anti-inflammatory medicine. °? Pain medicine for inflammatory conditions. °? Antibiotic medicine, if an infection is present. °? Medicines to dissolve blood clots. °? Medicines to treat coronary artery disease (CAD). °· Supportive care for conditions that   do not require medicines. This may include: °? Resting. °? Applying heat or cold packs to injured areas. °? Limiting activities until pain decreases. ° °Follow these instructions at home: °Medicines °· If you were prescribed an antibiotic, take it as told by your health care provider. Do not stop taking the  antibiotic even if you start to feel better. °· Take over-the-counter and prescription medicines only as told by your health care provider. °Lifestyle °· Do not use any products that contain nicotine or tobacco, such as cigarettes and e-cigarettes. If you need help quitting, ask your health care provider. °· Do not drink alcohol. °· Make lifestyle changes as directed by your health care provider. These may include: °? Getting regular exercise. Ask your health care provider to suggest some activities that are safe for you. °? Eating a heart-healthy diet. A registered dietitian can help you to learn healthy eating options. °? Maintaining a healthy weight. °? Managing diabetes, if necessary. °? Reducing stress, such as with yoga or relaxation techniques. °General instructions °· Avoid any activities that bring on chest pain. °· If heartburn is the cause for your chest pain, raise (elevate) the head of your bed about 6 inches (15 cm) by putting blocks under the legs. Sleeping with more pillows does not effectively relieve heartburn because it only changes the position of your head. °· Keep all follow-up visits as told by your health care provider. This is important. This includes any further testing if your chest pain does not go away. °Contact a health care provider if: °· Your chest pain does not go away. °· You have a rash with blisters on your chest. °· You have a fever. °· You have chills. °Get help right away if: °· Your chest pain is worse. °· You have a cough that gets worse, or you cough up blood. °· You have severe pain in your abdomen. °· You have severe weakness. °· You faint. °· You have sudden, unexplained chest discomfort. °· You have sudden, unexplained discomfort in your arms, back, neck, or jaw. °· You have shortness of breath at any time. °· You suddenly start to sweat, or your skin gets clammy. °· You feel nauseous or you vomit. °· You suddenly feel light-headed or dizzy. °· Your heart begins to beat  quickly, or it feels like it is skipping beats. °These symptoms may represent a serious problem that is an emergency. Do not wait to see if the symptoms will go away. Get medical help right away. Call your local emergency services (911 in the [place] Do not drive yourself to the hospital. °This information is not intended to replace advice given to you by your health care provider. Make sure you discuss any questions you have with your health care provider. °Document Released: 08/10/2005 Document Revised: 07/25/2016 Document Reviewed: 07/25/2016 °Elsevier Interactive Patient Education © 2017 [employer] ° °

## 2017-07-07 NOTE — Assessment & Plan Note (Signed)
Unclear etiology Patient breathing comfortably, but states she is currently dyspneic Lung exam clear Check CBC as anemia could cause this No signs of VTE on exam Return precautions discussed

## 2017-07-07 NOTE — Assessment & Plan Note (Signed)
Possible electrolyte/vitamin deficiency in the setting of recent pregnancy Otherwise neuro intact Check TSH, Vit B12, CMP Recently with neg HIV, RPR during pregnancy

## 2017-07-08 LAB — CBC WITH DIFFERENTIAL/PLATELET
BASOS ABS: 0 10*3/uL (ref 0.0–0.2)
Basos: 0 %
EOS (ABSOLUTE): 0.2 10*3/uL (ref 0.0–0.4)
Eos: 4 %
HEMOGLOBIN: 13.6 g/dL (ref 11.1–15.9)
Hematocrit: 39.6 % (ref 34.0–46.6)
Immature Grans (Abs): 0 10*3/uL (ref 0.0–0.1)
Immature Granulocytes: 0 %
LYMPHS ABS: 1.5 10*3/uL (ref 0.7–3.1)
Lymphs: 31 %
MCH: 29.6 pg (ref 26.6–33.0)
MCHC: 34.3 g/dL (ref 31.5–35.7)
MCV: 86 fL (ref 79–97)
MONOCYTES: 8 %
Monocytes Absolute: 0.4 10*3/uL (ref 0.1–0.9)
NEUTROS ABS: 2.8 10*3/uL (ref 1.4–7.0)
Neutrophils: 57 %
PLATELETS: 206 10*3/uL (ref 150–379)
RBC: 4.59 x10E6/uL (ref 3.77–5.28)
RDW: 13.4 % (ref 12.3–15.4)
WBC: 4.9 10*3/uL (ref 3.4–10.8)

## 2017-07-08 LAB — TSH: TSH: 1.47 u[IU]/mL (ref 0.450–4.500)

## 2017-07-08 LAB — COMPREHENSIVE METABOLIC PANEL
ALBUMIN: 4.5 g/dL (ref 3.5–5.5)
ALT: 10 IU/L (ref 0–32)
AST: 15 IU/L (ref 0–40)
Albumin/Globulin Ratio: 2 (ref 1.2–2.2)
Alkaline Phosphatase: 52 IU/L (ref 39–117)
BUN / CREAT RATIO: 16 (ref 9–23)
BUN: 11 mg/dL (ref 6–24)
Bilirubin Total: 0.4 mg/dL (ref 0.0–1.2)
CO2: 24 mmol/L (ref 20–29)
CREATININE: 0.67 mg/dL (ref 0.57–1.00)
Calcium: 9.1 mg/dL (ref 8.7–10.2)
Chloride: 101 mmol/L (ref 96–106)
GFR calc Af Amer: 122 mL/min/{1.73_m2} (ref >59)
GFR, EST NON AFRICAN AMERICAN: 106 mL/min/{1.73_m2} (ref >59)
GLOBULIN, TOTAL: 2.2 g/dL (ref 1.5–4.5)
GLUCOSE: 107 mg/dL — AB (ref 65–99)
Potassium: 4.4 mmol/L (ref 3.5–5.2)
SODIUM: 139 mmol/L (ref 134–144)
TOTAL PROTEIN: 6.7 g/dL (ref 6.0–8.5)

## 2017-07-08 LAB — HEMOGLOBIN A1C
Est. average glucose Bld gHb Est-mCnc: 103 mg/dL
Hgb A1c MFr Bld: 5.2 % (ref 4.8–5.6)

## 2017-07-08 LAB — VITAMIN B12: VITAMIN B 12: 313 pg/mL (ref 232–1245)

## 2017-07-08 LAB — VITAMIN D 25 HYDROXY (VIT D DEFICIENCY, FRACTURES): VIT D 25 HYDROXY: 35 ng/mL (ref 30.0–100.0)

## 2017-07-10 ENCOUNTER — Telehealth: Payer: Self-pay

## 2017-07-10 NOTE — Telephone Encounter (Signed)
Left message advising pt. OK per DPR. 

## 2017-07-10 NOTE — Telephone Encounter (Signed)
-----   Message from Erasmo Downer, MD sent at 07/10/2017  9:14 AM EDT ----- Normal A1c (no diabetes), blood counts, electrolytes, kidney function, liver function, thyroid function, vitamin D levels, vitamin B12 levels.  Erasmo Downer, MD, MPH [facility] 07/10/2017 9:14 AM

## 2017-08-07 ENCOUNTER — Telehealth: Payer: Self-pay | Admitting: Obstetrics and Gynecology

## 2017-08-07 MED ORDER — IBUPROFEN 800 MG PO TABS: 800.0000 mg | ORAL_TABLET | Freq: Three times a day (TID) | ORAL | 1 refills | Status: DC | PRN

## 2017-08-07 NOTE — Telephone Encounter (Signed)
Patient called requesting a refill on ibuprofen.Thanks °

## 2017-08-07 NOTE — Telephone Encounter (Signed)
Pt aware med erx. 

## 2017-08-30 NOTE — Progress Notes (Signed)
Cardiology Office Note  Date:  08/31/2017   ID:  Halina MaidensChristine L Talbot, DOB 05/08/1971, MRN [id number]  PCP:  Erasmo DownerBacigalupo, Angela M, MD   Chief Complaint  Patient presents with  . OTHER    Chest pain, tingling toes and fingers and Sob. Meds reviewed verbally with pt.    HPI:  Ms. Marthann SchillerFosteris a [phone] short woman with past medical history of Chronic knee pain Postpartum depression Who presents by referral from Dr. 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  Recent Pregnancy, uneventful State active through her pregnancy exercising and teaching yoga but did less aerobic Following delivery of baby, stride to stay active Several injuries to her knees, most recently 2 days ago  She feels less motivated than before  More fatigue Main complaints are knee pain, tingling in her toes and fingers She is concerned about circulation Mild increasing shortness of breath on exertion, denies significant chest pain  Tingling seems to present more at nighttime  Blood pressure typically runs low but she is asymptomatic  revious lab work reviewed with her in detail HBA1C 5.2 Vit D  313  Several stressors including Father with lung CA, taking care of newborn  EKG personally reviewed by myself on todays visit Shows normal sinus rhythm rate 65 beats a minute no significant ST or T-wave changes   PMH:   has a past medical history of Heart palpitations; History of ITP (10/14/2016); History of miscarriage, currently pregnant; and Vaginal Pap smear, abnormal.  PSH:    Past Surgical History:  Procedure Laterality Date  . LEEP      Current Outpatient Prescriptions  Medication Sig Dispense Refill  . acyclovir (ZOVIRAX) 400 MG tablet TAKE 1 TABLET (400 MG TOTAL) BY MOUTH 2 (TWO) TIMES DAILY. 60 tablet 2  . ferrous sulfate 325 (65 FE) MG tablet TAKE 1 TABLET (325 MG  TOTAL) BY MOUTH DAILY WITH BREAKFAST. 60 tablet 0  . folic acid (FOLVITE) 1 MG tablet TAKE 1 TABLET (1 MG TOTAL) BY MOUTH DAILY. 90 tablet 1  . ibuprofen (ADVIL,MOTRIN) 800 MG tablet Take 1 tablet (800 mg total) by mouth every 8 (eight) hours as needed. 60 tablet 1  . Prenatal Vit-Fe Fumarate-FA (PRENATAL MULTIVITAMIN) TABS tablet Take 1 tablet by mouth daily at 12 noon.     No current facility-administered medications for this visit.      Allergies:   Adhesive [tape]   Social History:  The patient  reports that she has never smoked. She has never used smokeless tobacco. She reports that she does not drink alcohol or use drugs.   Family History:   family history includes COPD in her father; Heart disease in her mother; Hypertension in her mother; Lung cancer in her father; Melanoma in her mother; Muscular dystrophy in her brother; Seizures in her brother.    Review of Systems: Review of Systems  Constitutional: Negative.   Respiratory: Positive for shortness of breath.   Cardiovascular: Negative.   Gastrointestinal: Negative.   Musculoskeletal: Negative.   Neurological: Negative.        Tingling in her fingers and toes at nighttime  Psychiatric/Behavioral: Negative.   All other systems reviewed and are negative.    PHYSICAL EXAM: VS:  BP 94/64 (BP Location: Right Arm, Patient Position: Sitting, Cuff Size: Normal)   Pulse 65   Ht 5\' 4"  (1.626 m)  Wt 147 lb 12 oz (67 kg)   BMI 25.36 kg/m  , BMI Body mass index is 25.36 kg/m. GEN: Well nourished, well developed, in no acute distress  HEENT: normal  Neck: no JVD, carotid bruits, or masses Cardiac: RRR; no murmurs, rubs, or gallops,no edema  Respiratory:  clear to auscultation bilaterally, normal work of breathing GI: soft, nontender, nondistended, + BS MS: no deformity or atrophy  Skin: warm and dry, no rash Neuro:  Strength and sensation are intact Psych: euthymic mood, full affect    Recent Labs: 07/07/2017: ALT 10;  BUN 11; Creatinine, Ser 0.67; Hemoglobin 13.6; Platelets 206; Potassium 4.4; Sodium 139; TSH 1.470    Lipid Panel Lab Results  Component Value Date   CHOL 191 03/23/2017   HDL 63 03/23/2017   LDLCALC 99 03/23/2017   TRIG 146 03/23/2017      Wt Readings from Last 3 Encounters:  08/31/17 147 lb 12 oz (67 kg)  07/07/17 145 lb (65.8 kg)  03/23/17 144 lb 1.6 oz (65.4 kg)       ASSESSMENT AND PLAN:  Shortness of breath - Plan: EKG 12-Lead Very mild shortness of breath likely exacerbated by fatigue, decreased conditioning taking care of baby Less likely secondary to ischemia. No risk factors for coronary disease. Discussed with her in detail If symptoms get worse could certainly perform various types of testing, all discussed with her in detail  Chest pain, unspecified type - Plan: EKG 12-Lead Denies having significant chest pain on exertion Any symptoms likely related to stress Discussed various types of workup including CT Coronary calcium scoring, stress testing  Tingling of both feet Long history of dance, trauma to her knees and feet Likely peripheral neuropathy  Fatigue, unspecified type Likely poor sleep, waking up to 3 times per night to take care of child Unable to exclude component of postpartum depression Exercising less than her baseline Less likely cardiac etiology  Chronic knee pain Previously seen by orthopedics, cortisone did not seem to work for her Repeated trauma, occupational as she is dancing   Disposition:   F/U  as needed   Total encounter time more than 60 minutes  Greater than 50% was spent in counseling and coordination of care with the patient  Patient was seen in consultation for Dr.Bacigalupo and will be referred back to her office for ongoing care of the she's detailed above    Orders Placed This Encounter  Procedures  . EKG 12-Lead     Signed, Dossie Arbour, M.D., Ph.D. 08/31/2017  [facility] [place],  [place] [phone]

## 2017-08-31 ENCOUNTER — Ambulatory Visit (INDEPENDENT_AMBULATORY_CARE_PROVIDER_SITE_OTHER): Payer: BLUE CROSS/BLUE SHIELD | Admitting: Cardiovascular Disease

## 2017-08-31 ENCOUNTER — Encounter: Payer: Self-pay | Admitting: Cardiovascular Disease

## 2017-08-31 VITALS — BP 94/64 | HR 65 | Ht 64.0 in | Wt 147.8 lb

## 2017-08-31 DIAGNOSIS — R079 Chest pain, unspecified: Secondary | ICD-10-CM | POA: Diagnosis not present

## 2017-08-31 DIAGNOSIS — R5383 Other fatigue: Secondary | ICD-10-CM

## 2017-08-31 DIAGNOSIS — R0602 Shortness of breath: Secondary | ICD-10-CM

## 2017-08-31 DIAGNOSIS — R202 Paresthesia of skin: Secondary | ICD-10-CM | POA: Diagnosis not present

## 2017-08-31 NOTE — Patient Instructions (Signed)
Medication Instructions:   No medication changes made  Labwork:  No new labs needed  Testing/Procedures:  No further testing at this time  Research CT coronary calcium score, $150   Follow-Up: It was a pleasure seeing you in the office today. Please call us if you have new issues that need to be addressed before your next appt.  [phone]  Your physician wants you to follow-up in:  As needed  If you need a refill on your cardiac medications before your next appointment, please call your pharmacy.

## 2017-09-04 ENCOUNTER — Ambulatory Visit: Payer: Self-pay | Admitting: Family Medicine

## 2017-11-01 ENCOUNTER — Other Ambulatory Visit: Payer: Self-pay | Admitting: Obstetrics and Gynecology

## 2017-11-16 ENCOUNTER — Ambulatory Visit: Payer: BLUE CROSS/BLUE SHIELD | Admitting: Family Medicine

## 2017-11-16 ENCOUNTER — Encounter: Payer: Self-pay | Admitting: Family Medicine

## 2017-11-16 VITALS — BP 98/64 | HR 97 | Temp 98.1°F | Resp 16 | Wt 152.0 lb

## 2017-11-16 DIAGNOSIS — J0101 Acute recurrent maxillary sinusitis: Secondary | ICD-10-CM

## 2017-11-16 MED ORDER — AMOXICILLIN-POT CLAVULANATE 875-125 MG PO TABS: 1.0000 | ORAL_TABLET | Freq: Two times a day (BID) | ORAL | 0 refills | Status: DC

## 2017-11-16 NOTE — Progress Notes (Signed)
Patient: Erin Gomez Female    DOB: [birth year]   47 y.o.   MRN: [id number] Visit Date: 11/16/2017  Today's Provider: Shirlee Latch, MD   Chief Complaint  Patient presents with  . Sinusitis   I, Emily Ratchford, CMA, am acting as scribe for Shirlee Latch, MD.  Subjective:    Sinusitis  This is a new problem. Episode onset: sx started with a cold on 11/07/2014. The problem has been gradually worsening since onset. There has been no fever. Associated symptoms include congestion, coughing (productive of green sputum), ear pain (left ear pressure), a hoarse voice, neck pain, shortness of breath, sinus pressure and sneezing. Pertinent negatives include no chills, diaphoresis, headaches, sore throat or swollen glands. Treatments tried: NyQuil and DayQuil. The treatment provided mild relief.  Pt states Z Pak is typically ineffective, and is usually prescribed Ceftin for sinusitis.  She has had extensive facial and nasal reconstruction after accident several years ago.  She has had sinusitis ~q39m since that time.  She has previously been evaluated by ENT for this problem.     Allergies  Allergen Reactions  . Adhesive [Tape] Rash     Current Outpatient Medications:  .  acyclovir (ZOVIRAX) 400 MG tablet, TAKE 1 TABLET (400 MG TOTAL) BY MOUTH 2 (TWO) TIMES DAILY., Disp: 60 tablet, Rfl: 2 .  ferrous sulfate 325 (65 FE) MG tablet, TAKE 1 TABLET (325 MG TOTAL) BY MOUTH DAILY WITH BREAKFAST., Disp: 60 tablet, Rfl: 0 .  ibuprofen (ADVIL,MOTRIN) 800 MG tablet, Take 1 tablet (800 mg total) by mouth every 8 (eight) hours as needed., Disp: 60 tablet, Rfl: 1 .  Prenatal Vit-Fe Fumarate-FA (PRENATAL MULTIVITAMIN) TABS tablet, Take 1 tablet by mouth daily at 12 noon., Disp: , Rfl:   Review of Systems  Constitutional: Negative for chills and diaphoresis.  HENT: Positive for congestion, ear pain (left ear pressure), hoarse voice, sinus pressure and sneezing. Negative for sore throat.     Respiratory: Positive for cough (productive of green sputum) and shortness of breath.   Musculoskeletal: Positive for neck pain.  Neurological: Negative for headaches.    Social History   Tobacco Use  . Smoking status: Never Smoker  . Smokeless tobacco: Never Used  Substance Use Topics  . Alcohol use: No    Comment: occasional glass of wine   Objective:   BP 98/64 (BP Location: Left Arm, Patient Position: Sitting, Cuff Size: Normal)   Pulse 97   Temp 98.1 F (36.7 C) (Oral)   Resp 16   Wt 152 lb (68.9 kg)   LMP 11/13/2017   SpO2 97%   BMI 26.09 kg/m  Vitals:   11/16/17 1328  BP: 98/64  Pulse: 97  Resp: 16  Temp: 98.1 F (36.7 C)  TempSrc: Oral  SpO2: 97%  Weight: 152 lb (68.9 kg)     Physical Exam  Constitutional: She is oriented to person, place, and time. She appears well-developed and well-nourished. No distress.  HENT:  Head: Normocephalic and atraumatic.  Right Ear: Tympanic membrane, external ear and ear canal normal.  Left Ear: Tympanic membrane, external ear and ear canal normal.  Nose: Mucosal edema present. No rhinorrhea. Right sinus exhibits maxillary sinus tenderness. Right sinus exhibits no frontal sinus tenderness. Left sinus exhibits maxillary sinus tenderness. Left sinus exhibits no frontal sinus tenderness.  Mouth/Throat: Uvula is midline, oropharynx is clear and moist and mucous membranes are normal. No oropharyngeal exudate.  Eyes: Conjunctivae are normal. Pupils are equal,  round, and reactive to light. No scleral icterus.  Neck: Neck supple. No thyromegaly present.  Cardiovascular: Normal rate, regular rhythm, normal heart sounds and intact distal pulses.  No murmur heard. Pulmonary/Chest: Effort normal and breath sounds normal. No respiratory distress. She has no wheezes. She has no rales.  Musculoskeletal: She exhibits no edema.  Lymphadenopathy:    She has no cervical adenopathy.  Neurological: She is alert and oriented to person, place,  and time.  Psychiatric: She has a normal mood and affect. Her behavior is normal.  Vitals reviewed.      Assessment & Plan:     1. Acute recurrent maxillary sinusitis - history consistent with acute rhinosinusitis - given no improvement despite supportive treatment x10 days, will treat with antibiotics - also discussed symptomatic treatment with nasal rinses, flonase, pseudofed prn - return precautions discussed - if continues to recur, consider ENT eval   Meds ordered this encounter  Medications  . amoxicillin-clavulanate (AUGMENTIN) 875-125 MG tablet    Sig: Take 1 tablet by mouth 2 (two) times daily.    Dispense:  14 tablet    Refill:  0    Return if symptoms worsen or fail to improve.     The entirety of the information documented in the History of Present Illness, Review of Systems and Physical Exam were personally obtained by me. Portions of this information were initially documented by Irving BurtonEmily Ratchford, CMA and reviewed by me for thoroughness and accuracy.    Erasmo DownerBacigalupo, Angela M, MD, MPH [facility] 11/16/2017 2:03 PM

## 2017-11-16 NOTE — Patient Instructions (Signed)

## 2018-01-15 ENCOUNTER — Encounter: Payer: Self-pay | Admitting: Obstetrics and Gynecology

## 2018-01-15 ENCOUNTER — Encounter
Admission: RE | Admit: 2018-01-15 | Discharge: 2018-01-15 | Disposition: A | Discharge status: Home / Self Care | Payer: BLUE CROSS/BLUE SHIELD | Source: Ambulatory Visit | Attending: Obstetrics and Gynecology | Admitting: Obstetrics and Gynecology

## 2018-01-15 ENCOUNTER — Other Ambulatory Visit: Payer: Self-pay | Admitting: Obstetrics and Gynecology

## 2018-01-15 ENCOUNTER — Other Ambulatory Visit: Payer: BLUE CROSS/BLUE SHIELD

## 2018-01-15 ENCOUNTER — Ambulatory Visit (INDEPENDENT_AMBULATORY_CARE_PROVIDER_SITE_OTHER): Payer: BLUE CROSS/BLUE SHIELD

## 2018-01-15 ENCOUNTER — Other Ambulatory Visit: Payer: Self-pay

## 2018-01-15 ENCOUNTER — Ambulatory Visit: Payer: BLUE CROSS/BLUE SHIELD | Admitting: Obstetrics and Gynecology

## 2018-01-15 VITALS — BP 104/68 | HR 77 | Ht 64.0 in | Wt 149.3 lb

## 2018-01-15 DIAGNOSIS — O021 Missed abortion: Secondary | ICD-10-CM

## 2018-01-15 DIAGNOSIS — O09521 Supervision of elderly multigravida, first trimester: Secondary | ICD-10-CM

## 2018-01-15 DIAGNOSIS — Z888 Allergy status to other drugs, medicaments and biological substances status: Secondary | ICD-10-CM | POA: Diagnosis not present

## 2018-01-15 DIAGNOSIS — M1990 Unspecified osteoarthritis, unspecified site: Secondary | ICD-10-CM | POA: Diagnosis not present

## 2018-01-15 HISTORY — DX: Unspecified osteoarthritis, unspecified site: M19.90

## 2018-01-15 HISTORY — DX: Dyspnea, unspecified: R06.00

## 2018-01-15 LAB — CBC WITH DIFFERENTIAL/PLATELET
Basophils Absolute: 0 10*3/uL (ref 0–0.1)
Basophils Relative: 1 %
EOS PCT: 2 %
Eosinophils Absolute: 0.1 10*3/uL (ref 0–0.7)
HEMATOCRIT: 40.6 % (ref 35.0–47.0)
Hemoglobin: 13.6 g/dL (ref 12.0–16.0)
LYMPHS PCT: 18 %
Lymphs Abs: 1 10*3/uL (ref 1.0–3.6)
MCH: 29.6 pg (ref 26.0–34.0)
MCHC: 33.4 g/dL (ref 32.0–36.0)
MCV: 88.4 fL (ref 80.0–100.0)
MONO ABS: 0.3 10*3/uL (ref 0.2–0.9)
Monocytes Relative: 6 %
NEUTROS ABS: 4.1 10*3/uL (ref 1.4–6.5)
Neutrophils Relative %: 73 %
PLATELETS: 209 10*3/uL (ref 150–440)
RBC: 4.59 MIL/uL (ref 3.80–5.20)
RDW: 13 % (ref 11.5–14.5)
WBC: 5.5 10*3/uL (ref 3.6–11.0)

## 2018-01-15 LAB — RAPID HIV SCREEN (HIV 1/2 AB+AG)
HIV 1/2 Antibodies: NONREACTIVE
HIV-1 P24 Antigen - HIV24: NONREACTIVE

## 2018-01-15 LAB — TYPE AND SCREEN
ABO/RH(D): A NEG
ANTIBODY SCREEN: NEGATIVE
Extend sample reason: UNDETERMINED

## 2018-01-15 MED ORDER — RHO D IMMUNE GLOBULIN 1500 UNITS IM SOSY
1500.0000 [IU] | PREFILLED_SYRINGE | Freq: Once | INTRAMUSCULAR | Status: AC
  Administered 2018-01-15: 1500 [IU] via INTRAMUSCULAR

## 2018-01-15 NOTE — Addendum Note (Signed)
Addended by: Marchelle FolksMILLER, Zyrah Wiswell G on: 01/15/2018 04:57 PM   Modules accepted: Orders

## 2018-01-15 NOTE — Progress Notes (Signed)
PREOPERATIVE HISTORY AND PHYSICAL   Date of surgery: 01/16/2018 Diagnosis: Missed AB-7 weeks Procedure: Suction D&C    Patient is a 47 y.o. G10P1087female scheduled for suction D&C on 01/16/2018 for management of missed AB.  Patient is Rh-.  Patient has received RhoGam on 01/15/2018.  Pelvic ultrasound x2 has verified intrauterine pregnancy measuring [redacted] weeks gestation without fetal cardiac activity. Patient reports no vaginal bleeding or pelvic pain.  OB History    Gravida Para Term Preterm AB Living   10 1 1   8 1    SAB TAB Ectopic Multiple Live Births   8     0 1      Patient's last menstrual period was 11/13/2017 (approximate).    Past Medical History:  Diagnosis Date  . Heart palpitations   . History of ITP 10/14/2016  . History of miscarriage, currently pregnant   . Vaginal Pap smear, abnormal     Past Surgical History:  Procedure Laterality Date  . LEEP      OB History  Gravida Para Term Preterm AB Living  10 1 1   8 1   SAB TAB Ectopic Multiple Live Births  8     0 1    # Outcome Date GA Lbr Len/2nd Weight Sex Delivery Anes PTL Lv  10 Current           9 Term 10/14/16 [redacted]w[redacted]d / 02:06 8 lb 12 oz (3.97 kg) F Vag-Spont None  LIV  8 SAB           7 SAB           6 SAB           5 SAB           4 SAB           3 SAB           2 SAB           1 SAB               Social History   Socioeconomic History  . Marital status: Married    Spouse name: None  . Number of children: None  . Years of education: None  . Highest education level: None  Social Needs  . Financial resource strain: None  . Food insecurity - worry: None  . Food insecurity - inability: None  . Transportation needs - medical: None  . Transportation needs - non-medical: None  Occupational History  . None  Tobacco Use  . Smoking status: Never Smoker  . Smokeless tobacco: Never Used  Substance and Sexual Activity  . Alcohol use: Yes    Comment: occasional glass of wine  . Drug use: No  .  Sexual activity: Yes    Birth control/protection: Rhythm  Other Topics Concern  . None  Social History Narrative  . None    Family History  Problem Relation Age of Onset  . Heart disease Mother        ??  . Hypertension Mother   . Melanoma Mother   . COPD Father   . Lung cancer Father   . Muscular dystrophy Brother        reports muscular degenerative disease, only R arm affected  . Seizures Brother      (Not in a hospital admission)  Allergies  Allergen Reactions  . Adhesive [Tape] Rash    Review of Systems Constitutional: No recent fever/chills/sweats Respiratory: No recent cough/bronchitis Cardiovascular: No  chest pain Gastrointestinal: No recent nausea/vomiting/diarrhea Genitourinary: No UTI symptoms Hematologic/lymphatic:No history of coagulopathy or recent blood thinner use    Objective:    BP 104/68   Pulse 77   Ht 5\' 4"  (1.626 m)   Wt 149 lb 4.8 oz (67.7 kg)   LMP 11/13/2017 (Approximate)   Breastfeeding? No   BMI 25.63 kg/m   General:   Normal  Skin:   normal  HEENT:  Normal  Neck:  Supple without Adenopathy or Thyromegaly  Lungs:   Heart:              Breasts:   Abdomen:  Pelvis:  M/S   Extremeties:  Neuro:    clear to auscultation bilaterally   Normal without murmur   Not Examined   soft, non-tender; bowel sounds normal; no masses,  no organomegaly   Exam deferred to OR  No CVAT  Warm/Dry   Normal        01/15/2018 pelvic exam: External genitalia-normal BUS-normal Vagina-normal Cervix-no lesions; osseous closed; no cervical motion tenderness Uterus-6-8 weeks size, midplane, mobile, nontender Adnexa-nonpalpable and nontender Rectovaginal-normal external exam  Assessment:    Missed AB at [redacted] weeks gestation Rh-, status post RhoGam administration   Plan:   Suction D&C  Preop counseling: The patient is to undergo suction D&C for missed AB at [redacted] weeks gestation.  She is understanding of the planned procedure and is aware of and  is accepting of all surgical risks which include but are not limited to bleeding, infection, pelvic organ injury with need for repair, blood clot disorders, anesthesia risk, etc.  All questions have been answered.  Informed consent is given.  Patient is ready willing to proceed with surgery as scheduled. The patient has received RhoGam due to Rh- status.  Herold Harms, MD  Note: This dictation was prepared with Dragon dictation along with smaller phrase technology. Any transcriptional errors that result from this process are unintentional.

## 2018-01-15 NOTE — Patient Instructions (Signed)
1.  Ultrasound is scheduled for 430 today to verify miscarriage 2.  Proceed to same day surgery immediately for preop appointment 3.  Suction D&C for management of miscarriage will be performed at 730 tomorrow morning 4.  Return 1 week for postop check

## 2018-01-15 NOTE — H&P (View-Only) (Signed)
PREOPERATIVE HISTORY AND PHYSICAL   Date of surgery: 01/16/2018 Diagnosis: Missed AB-7 weeks Procedure: Suction D&C    Patient is a 47 y.o. G10P1087female scheduled for suction D&C on 01/16/2018 for management of missed AB.  Patient is Rh-.  Patient has received RhoGam on 01/15/2018.  Pelvic ultrasound x2 has verified intrauterine pregnancy measuring [redacted] weeks gestation without fetal cardiac activity. Patient reports no vaginal bleeding or pelvic pain.  OB History    Gravida Para Term Preterm AB Living   10 1 1   8 1    SAB TAB Ectopic Multiple Live Births   8     0 1      Patient's last menstrual period was 11/13/2017 (approximate).    Past Medical History:  Diagnosis Date  . Heart palpitations   . History of ITP 10/14/2016  . History of miscarriage, currently pregnant   . Vaginal Pap smear, abnormal     Past Surgical History:  Procedure Laterality Date  . LEEP      OB History  Gravida Para Term Preterm AB Living  10 1 1   8 1   SAB TAB Ectopic Multiple Live Births  8     0 1    # Outcome Date GA Lbr Len/2nd Weight Sex Delivery Anes PTL Lv  10 Current           9 Term 10/14/16 [redacted]w[redacted]d / 02:06 8 lb 12 oz (3.97 kg) F Vag-Spont None  LIV  8 SAB           7 SAB           6 SAB           5 SAB           4 SAB           3 SAB           2 SAB           1 SAB               Social History   Socioeconomic History  . Marital status: Married    Spouse name: None  . Number of children: None  . Years of education: None  . Highest education level: None  Social Needs  . Financial resource strain: None  . Food insecurity - worry: None  . Food insecurity - inability: None  . Transportation needs - medical: None  . Transportation needs - non-medical: None  Occupational History  . None  Tobacco Use  . Smoking status: Never Smoker  . Smokeless tobacco: Never Used  Substance and Sexual Activity  . Alcohol use: Yes    Comment: occasional glass of wine  . Drug use: No  .  Sexual activity: Yes    Birth control/protection: Rhythm  Other Topics Concern  . None  Social History Narrative  . None    Family History  Problem Relation Age of Onset  . Heart disease Mother        ??  . Hypertension Mother   . Melanoma Mother   . COPD Father   . Lung cancer Father   . Muscular dystrophy Brother        reports muscular degenerative disease, only R arm affected  . Seizures Brother      (Not in a hospital admission)  Allergies  Allergen Reactions  . Adhesive [Tape] Rash    Review of Systems Constitutional: No recent fever/chills/sweats Respiratory: No recent cough/bronchitis Cardiovascular: No  chest pain Gastrointestinal: No recent nausea/vomiting/diarrhea Genitourinary: No UTI symptoms Hematologic/lymphatic:No history of coagulopathy or recent blood thinner use    Objective:    BP 104/68   Pulse 77   Ht 5\' 4"  (1.626 m)   Wt 149 lb 4.8 oz (67.7 kg)   LMP 11/13/2017 (Approximate)   Breastfeeding? No   BMI 25.63 kg/m   General:   Normal  Skin:   normal  HEENT:  Normal  Neck:  Supple without Adenopathy or Thyromegaly  Lungs:   Heart:              Breasts:   Abdomen:  Pelvis:  M/S   Extremeties:  Neuro:    clear to auscultation bilaterally   Normal without murmur   Not Examined   soft, non-tender; bowel sounds normal; no masses,  no organomegaly   Exam deferred to OR  No CVAT  Warm/Dry   Normal        01/15/2018 pelvic exam: External genitalia-normal BUS-normal Vagina-normal Cervix-no lesions; osseous closed; no cervical motion tenderness Uterus-6-8 weeks size, midplane, mobile, nontender Adnexa-nonpalpable and nontender Rectovaginal-normal external exam  Assessment:    Missed AB at [redacted] weeks gestation Rh-, status post RhoGam administration   Plan:   Suction D&C  Preop counseling: The patient is to undergo suction D&C for missed AB at [redacted] weeks gestation.  She is understanding of the planned procedure and is aware of and  is accepting of all surgical risks which include but are not limited to bleeding, infection, pelvic organ injury with need for repair, blood clot disorders, anesthesia risk, etc.  All questions have been answered.  Informed consent is given.  Patient is ready willing to proceed with surgery as scheduled. The patient has received RhoGam due to Rh- status.  Herold HarmsMartin A Defrancesco, MD  Note: This dictation was prepared with Dragon dictation along with smaller phrase technology. Any transcriptional errors that result from this process are unintentional.

## 2018-01-15 NOTE — Patient Instructions (Signed)
Your procedure is scheduled on: Tuesday,, MARCH 5TH  Report to THE SECOND FLOOR OF THE MEDICAL MALL  PLEASE BE HERE AT 6:00 AM  Remember: Instructions that are not followed completely may result in serious  medical risk, up to and including death, or upon the discretion of your surgeon   and anesthesiologist your surgery may need to be rescheduled.     _X__ 1. Do not eat food after midnight the night before your procedure.                 No gum chewing, lozengers or hard candies.                   You may drink clear liquids up to 2 hours before you are scheduled to arrive                  for your surgery- DO not drink clear                 liquids within 2 hours of the start of your surgery.                  Clear Liquids include:  water, apple juice without pulp, clear carbohydrate                 drink such as Clearfast of Gartorade, Black Coffee or Tea (Do not add                 anything to coffee or tea).  __X__2.  On the morning of surgery brush your teeth with toothpaste and water,                       you may rinse your mouth with mouthwash if you wish.                          Do not swallow any toothpaste of mouthwash.     _X__ 3.  No Alcohol for 24 hours before or after surgery.   _X__ 4.  Do Not Smoke or use e-cigarettes For 24 Hours Prior to Your Surgery.                 Do not use any chewable tobacco products for at least 6 hours prior to                 surgery.  ____  5.  Bring all medications with you on the day of surgery if instructed.   ____  6.  Notify your doctor if there is any change in your medical condition      (cold, fever, infections).     Do not wear jewelry, make-up, hairpins, clips or nail polish. Do not wear lotions, powders, or perfumes. You may wear deodorant. Do not shave 48 hours prior to surgery. Men may shave face and neck. Do not bring valuables to the hospital.    [facility] is not responsible for any  belongings or valuables.  Contacts, dentures or bridgework may not be worn into surgery. Leave your suitcase in the car. After surgery it may be brought to your room. For patients admitted to the hospital, discharge time is determined by your treatment team.   Patients discharged the day of surgery will not be allowed to drive home.   Please read over the following fact sheets that you were given:   Preparing for surgery  ____ Take these medicines the morning of surgery with A SIP OF WATER:    1. none  2.   3.   4.  5.  6.  ____ Stop aspirin products now!!  ____ Stop Anti-inflammatories now!!   ____ Stop supplements until after surgery.    ____ Bring C-Pap to the hospital.   Wear loose fitting clothing  Have pads at home for post op bleeding

## 2018-01-15 NOTE — H&P (Signed)
PREOPERATIVE HISTORY AND PHYSICAL   Date of surgery: 01/16/2018 Diagnosis: Missed AB-7 weeks Procedure: Suction D&C    Patient is a 47 y.o. G10P1087female scheduled for suction D&C on 01/16/2018 for management of missed AB.  Patient is Rh-.  Patient has received RhoGam on 01/15/2018.  Pelvic ultrasound x2 has verified intrauterine pregnancy measuring [redacted] weeks gestation without fetal cardiac activity. Patient reports no vaginal bleeding or pelvic pain.  OB History    Gravida Para Term Preterm AB Living   10 1 1   8 1    SAB TAB Ectopic Multiple Live Births   8     0 1      Patient's last menstrual period was 11/13/2017 (approximate).    Past Medical History:  Diagnosis Date  . Heart palpitations   . History of ITP 10/14/2016  . History of miscarriage, currently pregnant   . Vaginal Pap smear, abnormal     Past Surgical History:  Procedure Laterality Date  . LEEP      OB History  Gravida Para Term Preterm AB Living  10 1 1   8 1   SAB TAB Ectopic Multiple Live Births  8     0 1    # Outcome Date GA Lbr Len/2nd Weight Sex Delivery Anes PTL Lv  10 Current           9 Term 10/14/16 [redacted]w[redacted]d / 02:06 8 lb 12 oz (3.97 kg) F Vag-Spont None  LIV  8 SAB           7 SAB           6 SAB           5 SAB           4 SAB           3 SAB           2 SAB           1 SAB               Social History   Socioeconomic History  . Marital status: Married    Spouse name: None  . Number of children: None  . Years of education: None  . Highest education level: None  Social Needs  . Financial resource strain: None  . Food insecurity - worry: None  . Food insecurity - inability: None  . Transportation needs - medical: None  . Transportation needs - non-medical: None  Occupational History  . None  Tobacco Use  . Smoking status: Never Smoker  . Smokeless tobacco: Never Used  Substance and Sexual Activity  . Alcohol use: Yes    Comment: occasional glass of wine  . Drug use: No  .  Sexual activity: Yes    Birth control/protection: Rhythm  Other Topics Concern  . None  Social History Narrative  . None    Family History  Problem Relation Age of Onset  . Heart disease Mother        ??  . Hypertension Mother   . Melanoma Mother   . COPD Father   . Lung cancer Father   . Muscular dystrophy Brother        reports muscular degenerative disease, only R arm affected  . Seizures Brother      (Not in a hospital admission)  Allergies  Allergen Reactions  . Adhesive [Tape] Rash    Review of Systems Constitutional: No recent fever/chills/sweats Respiratory: No recent cough/bronchitis Cardiovascular: No  chest pain Gastrointestinal: No recent nausea/vomiting/diarrhea Genitourinary: No UTI symptoms Hematologic/lymphatic:No history of coagulopathy or recent blood thinner use    Objective:    BP 104/68   Pulse 77   Ht 5\' 4"  (1.626 m)   Wt 149 lb 4.8 oz (67.7 kg)   LMP 11/13/2017 (Approximate)   Breastfeeding? No   BMI 25.63 kg/m   General:   Normal  Skin:   normal  HEENT:  Normal  Neck:  Supple without Adenopathy or Thyromegaly  Lungs:   Heart:              Breasts:   Abdomen:  Pelvis:  M/S   Extremeties:  Neuro:    clear to auscultation bilaterally   Normal without murmur   Not Examined   soft, non-tender; bowel sounds normal; no masses,  no organomegaly   Exam deferred to OR  No CVAT  Warm/Dry   Normal        01/15/2018 pelvic exam: External genitalia-normal BUS-normal Vagina-normal Cervix-no lesions; osseous closed; no cervical motion tenderness Uterus-6-8 weeks size, midplane, mobile, nontender Adnexa-nonpalpable and nontender Rectovaginal-normal external exam  Assessment:    Missed AB at [redacted] weeks gestation Rh-, status post RhoGam administration   Plan:   Suction D&C  Preop counseling: The patient is to undergo suction D&C for missed AB at [redacted] weeks gestation.  She is understanding of the planned procedure and is aware of and  is accepting of all surgical risks which include but are not limited to bleeding, infection, pelvic organ injury with need for repair, blood clot disorders, anesthesia risk, etc.  All questions have been answered.  Informed consent is given.  Patient is ready willing to proceed with surgery as scheduled. The patient has received RhoGam due to Rh- status.  Herold HarmsMartin A Kody Brandl, MD  Note: This dictation was prepared with Dragon dictation along with smaller phrase technology. Any transcriptional errors that result from this process are unintentional.

## 2018-01-16 ENCOUNTER — Encounter
Admission: RE | Disposition: A | Discharge status: Home / Self Care | Payer: Self-pay | Source: Ambulatory Visit | Attending: Obstetrics and Gynecology

## 2018-01-16 ENCOUNTER — Ambulatory Visit
Admission: RE | Admit: 2018-01-16 | Discharge: 2018-01-16 | Disposition: A | Discharge status: Home / Self Care | Payer: BLUE CROSS/BLUE SHIELD | Source: Ambulatory Visit | Attending: Obstetrics and Gynecology | Admitting: Obstetrics and Gynecology

## 2018-01-16 ENCOUNTER — Encounter: Payer: Self-pay | Admitting: Obstetrics and Gynecology

## 2018-01-16 ENCOUNTER — Ambulatory Visit: Payer: BLUE CROSS/BLUE SHIELD | Admitting: Certified Registered Nurse Anesthetist

## 2018-01-16 DIAGNOSIS — O021 Missed abortion: Secondary | ICD-10-CM | POA: Diagnosis not present

## 2018-01-16 DIAGNOSIS — Z9889 Other specified postprocedural states: Secondary | ICD-10-CM

## 2018-01-16 DIAGNOSIS — Z888 Allergy status to other drugs, medicaments and biological substances status: Secondary | ICD-10-CM | POA: Insufficient documentation

## 2018-01-16 DIAGNOSIS — M1990 Unspecified osteoarthritis, unspecified site: Secondary | ICD-10-CM | POA: Insufficient documentation

## 2018-01-16 HISTORY — PX: DILATION AND EVACUATION: SHX1459

## 2018-01-16 LAB — RPR: RPR: NONREACTIVE

## 2018-01-16 SURGERY — DILATION AND EVACUATION, UTERUS
Anesthesia: General

## 2018-01-16 MED ORDER — SUCCINYLCHOLINE CHLORIDE 20 MG/ML IJ SOLN
INTRAMUSCULAR | Status: AC
  Filled 2018-01-16: qty 1

## 2018-01-16 MED ORDER — MIDAZOLAM HCL 2 MG/2ML IJ SOLN
INTRAMUSCULAR | Status: DC | PRN
  Administered 2018-01-16: 2 mg via INTRAVENOUS

## 2018-01-16 MED ORDER — ONDANSETRON HCL 4 MG/2ML IJ SOLN
INTRAMUSCULAR | Status: AC
  Filled 2018-01-16: qty 2

## 2018-01-16 MED ORDER — GLYCOPYRROLATE 0.2 MG/ML IJ SOLN
INTRAMUSCULAR | Status: AC
  Filled 2018-01-16: qty 1

## 2018-01-16 MED ORDER — EPHEDRINE SULFATE 50 MG/ML IJ SOLN
INTRAMUSCULAR | Status: AC
  Filled 2018-01-16: qty 1

## 2018-01-16 MED ORDER — MIDAZOLAM HCL 2 MG/2ML IJ SOLN
INTRAMUSCULAR | Status: AC
  Filled 2018-01-16: qty 2

## 2018-01-16 MED ORDER — PHENYLEPHRINE HCL 10 MG/ML IJ SOLN
INTRAMUSCULAR | Status: AC
  Filled 2018-01-16: qty 1

## 2018-01-16 MED ORDER — PROPOFOL 10 MG/ML IV BOLUS
INTRAVENOUS | Status: AC
  Filled 2018-01-16: qty 20

## 2018-01-16 MED ORDER — IBUPROFEN 800 MG PO TABS: 800.0000 mg | ORAL_TABLET | Freq: Three times a day (TID) | ORAL | 1 refills | Status: DC

## 2018-01-16 MED ORDER — LIDOCAINE HCL (PF) 2 % IJ SOLN
INTRAMUSCULAR | Status: AC
  Filled 2018-01-16: qty 10

## 2018-01-16 MED ORDER — PROPOFOL 10 MG/ML IV BOLUS
INTRAVENOUS | Status: DC | PRN
  Administered 2018-01-16: 150 mg via INTRAVENOUS
  Administered 2018-01-16: 50 mg via INTRAVENOUS

## 2018-01-16 MED ORDER — FENTANYL CITRATE (PF) 100 MCG/2ML IJ SOLN
INTRAMUSCULAR | Status: DC | PRN
  Administered 2018-01-16 (×4): 25 ug via INTRAVENOUS

## 2018-01-16 MED ORDER — KETOROLAC TROMETHAMINE 30 MG/ML IJ SOLN
INTRAMUSCULAR | Status: AC
  Filled 2018-01-16: qty 1

## 2018-01-16 MED ORDER — LACTATED RINGERS IV SOLN
INTRAVENOUS | Status: DC
  Administered 2018-01-16: 07:00:00 via INTRAVENOUS

## 2018-01-16 MED ORDER — FAMOTIDINE 20 MG PO TABS
ORAL_TABLET | ORAL | Status: AC
Start: 2018-01-16 — End: 2018-01-16
  Administered 2018-01-16: 20 mg
  Filled 2018-01-16: qty 1

## 2018-01-16 MED ORDER — FENTANYL CITRATE (PF) 100 MCG/2ML IJ SOLN: 25.0000 ug | INTRAMUSCULAR | Status: DC | PRN

## 2018-01-16 MED ORDER — LIDOCAINE HCL (CARDIAC) 20 MG/ML IV SOLN
INTRAVENOUS | Status: DC | PRN
  Administered 2018-01-16: 100 mg via INTRAVENOUS

## 2018-01-16 MED ORDER — DEXAMETHASONE SODIUM PHOSPHATE 10 MG/ML IJ SOLN
INTRAMUSCULAR | Status: DC | PRN
  Administered 2018-01-16: 10 mg via INTRAVENOUS

## 2018-01-16 MED ORDER — ONDANSETRON HCL 4 MG/2ML IJ SOLN
INTRAMUSCULAR | Status: DC | PRN
  Administered 2018-01-16: 4 mg via INTRAVENOUS

## 2018-01-16 MED ORDER — KETOROLAC TROMETHAMINE 30 MG/ML IJ SOLN
INTRAMUSCULAR | Status: DC | PRN
  Administered 2018-01-16: 30 mg via INTRAVENOUS

## 2018-01-16 MED ORDER — PROMETHAZINE HCL 25 MG/ML IJ SOLN: 6.2500 mg | INTRAMUSCULAR | Status: DC | PRN

## 2018-01-16 MED ORDER — FAMOTIDINE 20 MG PO TABS: 20.0000 mg | ORAL_TABLET | Freq: Once | ORAL | Status: DC

## 2018-01-16 MED ORDER — FENTANYL CITRATE (PF) 100 MCG/2ML IJ SOLN
INTRAMUSCULAR | Status: AC
  Filled 2018-01-16: qty 2

## 2018-01-16 MED ORDER — DEXAMETHASONE SODIUM PHOSPHATE 10 MG/ML IJ SOLN
INTRAMUSCULAR | Status: AC
  Filled 2018-01-16: qty 1

## 2018-01-16 SURGICAL SUPPLY — 19 items
CATH ROBINSON RED A/P 16FR (CATHETERS) ×2 IMPLANT
DRSG TELFA 3X8 NADH (GAUZE/BANDAGES/DRESSINGS) IMPLANT
GLOVE BIO SURGEON STRL SZ8 (GLOVE) ×2 IMPLANT
GOWN STRL REUS W/ TWL LRG LVL3 (GOWN DISPOSABLE) ×1 IMPLANT
GOWN STRL REUS W/ TWL XL LVL3 (GOWN DISPOSABLE) ×1 IMPLANT
GOWN STRL REUS W/TWL LRG LVL3 (GOWN DISPOSABLE) ×1
GOWN STRL REUS W/TWL XL LVL3 (GOWN DISPOSABLE) ×1
KIT BERKELEY 1ST TRIMESTER 3/8 (MISCELLANEOUS) ×2 IMPLANT
KIT TURNOVER CYSTO (KITS) ×2 IMPLANT
PACK DNC HYST (MISCELLANEOUS) ×2 IMPLANT
PAD OB MATERNITY 4.3X12.25 (PERSONAL CARE ITEMS) ×2 IMPLANT
PAD PREP 24X41 OB/GYN DISP (PERSONAL CARE ITEMS) ×2 IMPLANT
SET BERKELEY SUCTION TUBING (SUCTIONS) ×2 IMPLANT
SOL PREP PVP 2OZ (MISCELLANEOUS) ×2
SOLUTION PREP PVP 2OZ (MISCELLANEOUS) ×1 IMPLANT
SPONGE XRAY 4X4 16PLY STRL (MISCELLANEOUS) ×2 IMPLANT
TOWEL OR 17X26 4PK STRL BLUE (TOWEL DISPOSABLE) ×2 IMPLANT
VACURETTE 10 RIGID CVD (CANNULA) ×2 IMPLANT
VACURETTE 8 RIGID CVD (CANNULA) IMPLANT

## 2018-01-16 NOTE — Anesthesia Preprocedure Evaluation (Signed)
Anesthesia Evaluation  Patient identified by MRN, date of birth, ID band Patient awake    Reviewed: Allergy & Precautions, H&P , NPO status , Patient's Chart, lab work & pertinent test results, reviewed documented beta blocker date and time   History of Anesthesia Complications Negative for: history of anesthetic complications  Airway Mallampati: I  TM Distance: >3 FB Neck ROM: full    Dental  (+) Dental Advidsory Given, Teeth Intact, Chipped   Pulmonary neg pulmonary ROS,           Cardiovascular Exercise Tolerance: Good negative cardio ROS       Neuro/Psych negative neurological ROS  negative psych ROS   GI/Hepatic negative GI ROS, Neg liver ROS,   Endo/Other  negative endocrine ROS  Renal/GU negative Renal ROS  negative genitourinary   Musculoskeletal   Abdominal   Peds  Hematology negative hematology ROS (+)   Anesthesia Other Findings Past Medical History: No date: Arthritis No date: Dyspnea     Comment:  occasionally, may be disc related No date: Heart palpitations 10/14/2016: History of ITP No date: History of miscarriage, currently pregnant No date: Vaginal Pap smear, abnormal   Reproductive/Obstetrics negative OB ROS                             Anesthesia Physical Anesthesia Plan  ASA: I  Anesthesia Plan: General   Post-op Pain Management:    Induction: Intravenous  PONV Risk Score and Plan: 3 and Ondansetron and Dexamethasone  Airway Management Planned: LMA  Additional Equipment:   Intra-op Plan:   Post-operative Plan: Extubation in OR  Informed Consent: I have reviewed the patients History and Physical, chart, labs and discussed the procedure including the risks, benefits and alternatives for the proposed anesthesia with the patient or authorized representative who has indicated his/her understanding and acceptance.   Dental Advisory Given  Plan  Discussed with: Anesthesiologist, CRNA and Surgeon  Anesthesia Plan Comments:         Anesthesia Quick Evaluation

## 2018-01-16 NOTE — Discharge Instructions (Signed)

## 2018-01-16 NOTE — Anesthesia Post-op Follow-up Note (Signed)
Anesthesia QCDR form completed.        

## 2018-01-16 NOTE — Anesthesia Procedure Notes (Signed)
Procedure Name: LMA Insertion Performed by: Quynn Vilchis, CRNA Pre-anesthesia Checklist: Patient identified, Patient being monitored, Timeout performed, Emergency Drugs available and Suction available Patient Re-evaluated:Patient Re-evaluated prior to induction Oxygen Delivery Method: Circle system utilized Preoxygenation: Pre-oxygenation with 100% oxygen Induction Type: IV induction Ventilation: Mask ventilation without difficulty LMA: LMA inserted LMA Size: 3.5 Tube type: Oral Number of attempts: 1 Placement Confirmation: positive ETCO2 and breath sounds checked- equal and bilateral Tube secured with: Tape Dental Injury: Teeth and Oropharynx as per pre-operative assessment        

## 2018-01-16 NOTE — Anesthesia Postprocedure Evaluation (Signed)
Anesthesia Post Note  Patient: Erin JordanChristine H Gomez  Procedure(s) Performed: DILATATION AND EVACUATION (N/A )  Patient location during evaluation: PACU Anesthesia Type: General Level of consciousness: awake and alert Pain management: pain level controlled Vital Signs Assessment: post-procedure vital signs reviewed and stable Respiratory status: spontaneous breathing, nonlabored ventilation, respiratory function stable and patient connected to nasal cannula oxygen Cardiovascular status: blood pressure returned to baseline and stable Postop Assessment: no apparent nausea or vomiting Anesthetic complications: no     Last Vitals:  Vitals:   01/16/18 0848 01/16/18 0901  BP: 115/66 107/61  Pulse: 66   Resp: 20 16  Temp: (!) 36.1 C   SpO2: 100% 100%    Last Pain:  Vitals:   01/16/18 0848  TempSrc: Tympanic  PainSc: 0-No pain                 Lenard SimmerAndrew Skyy Mcknight

## 2018-01-16 NOTE — Transfer of Care (Signed)
Immediate Anesthesia Transfer of Care Note  Patient: Erin Gomez  Procedure(s) Performed: DILATATION AND EVACUATION (N/A )  Patient Location: PACU  Anesthesia Type:General  Level of Consciousness: sedated  Airway & Oxygen Therapy: Patient Spontanous Breathing and Patient connected to face mask oxygen  Post-op Assessment: Report given to RN and Post -op Vital signs reviewed and stable  Post vital signs: Reviewed and stable  Last Vitals:  Vitals:   01/16/18 0637  BP: (!) 116/43  Pulse: 70  Resp: 15  Temp: 36.9 C  SpO2: 99%    Last Pain:  Vitals:   01/16/18 0637  TempSrc: Oral         Complications: No apparent anesthesia complications

## 2018-01-16 NOTE — Op Note (Signed)
Jonathon Jordanhristine H Markwood PROCEDURE DATE: 01/16/2018 8:05 AM  PREOPERATIVE DIAGNOSIS: MISSED ABORTION POSTOPERATIVE DIAGNOSIS: MISSED ABORTION PROCEDURE: Procedure(s): DILATATION AND EVACUATION (N/A) SURGEON:  Dr. Daphine DeutscherMartin A Avantika Shere ASSISTANT: none ANESTHESIA: General  INDICATIONS: 47 y.o. [id number] with history ofMissed AB at 7-[redacted] weeks gestation presents for surgical management.   Please see preoperative notes for further details.   FINDINGS:  Uterus wasMidplane and 8 week size. Tissue consistent with products of conception was removed and sent to Pathology.   I/O's: Total I/O In: 700 [I.V.:700] Out: 400 [Urine:200; Blood:200] SPECIMENS: POC  COMPLICATIONS: None immediate COUNTS:  YES  PROCEDURE IN DETAIL: The patient was brought to the operating room where she was placed into the supine position.  General LMA anesthesia was induced without incident. She was placed in the dorsal lithotomy position using candy cane stirrups, and was examined; A Betadine prep and drape was performed in sterile fashion.   A  Red Robinson catheter was used to drain the bladder of urine. A weighted speculum was placed into  the vagina and a single tooth tenaculum was applied to the anterior lip of the cervix. The cervix was gently dilated using Hank's Dilators to accommodate a 10 mm suction curette, that was gently advanced to the uterine fundus.  The suction device was then activated and the curette was slowly rotated to clear the uterine cavity of products of conception.  A sharp curettage with a serrated curette  was then performed to confirm complete emptying of the uterus. Minimal bleeding was encountered. The tenaculum was removed along with all instruments  from the  vagina.   The patient was awakened, mobilized and taken to the recovery room in satisfactory condition.  The procedure was well-tolerated.  The patient will be discharged to home as per PACU criteria.  Routine postoperative instructions were given  along with a prescription for analgesics.  She will follow up in the clinic in 1-2 weeks for postoperative evaluation.  Herold HarmsMartin A Ranessa Kosta, MD [facility]

## 2018-01-16 NOTE — Interval H&P Note (Signed)
History and Physical Interval Note:  01/16/2018 6:59 AM  Erin Gomez  has presented today for surgery, with the diagnosis of MISSED AB  The various methods of treatment have been discussed with the patient and family. After consideration of risks, benefits and other options for treatment, the patient has consented to  Procedure(s): DILATATION AND EVACUATION (N/A) as a surgical intervention .  The patient's history has been reviewed, patient examined, no change in status, stable for surgery.  I have reviewed the patient's chart and labs.  Questions were answered to the patient's satisfaction.     Daphine DeutscherMartin A Defrancesco

## 2018-01-17 LAB — SURGICAL PATHOLOGY

## 2018-01-31 ENCOUNTER — Ambulatory Visit (INDEPENDENT_AMBULATORY_CARE_PROVIDER_SITE_OTHER): Payer: BLUE CROSS/BLUE SHIELD | Admitting: Obstetrics and Gynecology

## 2018-01-31 ENCOUNTER — Encounter: Payer: Self-pay | Admitting: Obstetrics and Gynecology

## 2018-01-31 VITALS — BP 101/64 | HR 74 | Ht 65.0 in | Wt 149.7 lb

## 2018-01-31 DIAGNOSIS — Z09 Encounter for follow-up examination after completed treatment for conditions other than malignant neoplasm: Secondary | ICD-10-CM

## 2018-01-31 DIAGNOSIS — Z9889 Other specified postprocedural states: Secondary | ICD-10-CM

## 2018-01-31 DIAGNOSIS — O021 Missed abortion: Secondary | ICD-10-CM

## 2018-01-31 NOTE — Progress Notes (Signed)
Chief complaint: 1.  Postop check 2.  Status post suction D&C for missed abortion  Surgical pathology: DIAGNOSIS:  A. PRODUCTS OF CONCEPTION; DILATION AND EVACUATION:  - DECIDUA AND CHORIONIC VILLI, CONSISTENT WITH PRODUCTS OF CONCEPTION.   A- blood type Antibody screen negative  Patient did receive RhoGam on 01/16/2018  Patient reports no significant pelvic pain or vaginal bleeding.  She is pretty much back to routine activities.  Husband is getting vasectomy in April.  Patient plans on resuming the minipill for contraception.  OBJECTIVE: BP 101/64   Pulse 74   Ht 5\' 5"  (1.651 m)   Wt 149 lb 11.2 oz (67.9 kg)   LMP 11/13/2017 (Approximate)   BMI 24.91 kg/m  Physical exam-deferred  ASSESSMENT: 1.  2-week status post suction D&C for missed AB 2.  Pathology consistent with decidua and chorionic villi  PLAN: 1.  Resume activities as tolerated 2.  Minipill for contraception; recommend condoms as backup until vasectomy is completed 3.  Resume follow-up care with Dr. Jonathon JordanKnowles Jonas  Delisha Peaden A Carmalita Wakefield, MD  Note: This dictation was prepared with Dragon dictation along with smaller phrase technology. Any transcriptional errors that result from this process are unintentional.

## 2018-01-31 NOTE — Patient Instructions (Signed)
1.  Resume activities as tolerated 2.  Resume Micronor (minipill) for contraception until spouse gets vasectomy. 3.  Recommend condoms as backup protection until sperm count is 0 4.  Resume routine care with Dr. Toya SmothersKnowles Jonas

## 2018-02-01 ENCOUNTER — Telehealth: Payer: Self-pay | Admitting: Obstetrics and Gynecology

## 2018-02-01 NOTE — Telephone Encounter (Signed)
Patient would like to know if she can start back on meloxicam post surgery. She stopped up when she had her procedure. Please Advise. Thanks

## 2018-02-02 NOTE — Telephone Encounter (Signed)
Pt aware she may resume meloxicam. NO ibup.

## 2018-03-21 ENCOUNTER — Telehealth: Payer: Self-pay | Admitting: Obstetrics and Gynecology

## 2018-03-21 ENCOUNTER — Other Ambulatory Visit: Payer: Self-pay | Admitting: *Deleted

## 2018-03-21 NOTE — Telephone Encounter (Signed)
The patient called and stated that she needs a refill on her birth control prescription. Please advise.

## 2018-03-22 ENCOUNTER — Other Ambulatory Visit: Payer: Self-pay | Admitting: *Deleted

## 2018-03-22 MED ORDER — NORETHIN-ETH ESTRAD-FE BIPHAS 1 MG-10 MCG / 10 MCG PO TABS: 1.0000 | ORAL_TABLET | Freq: Every day | ORAL | 11 refills | Status: DC

## 2018-03-22 NOTE — Telephone Encounter (Signed)
Done-ac 

## 2018-03-23 ENCOUNTER — Other Ambulatory Visit: Payer: Self-pay | Admitting: *Deleted

## 2018-03-23 MED ORDER — NORGESTIM-ETH ESTRAD TRIPHASIC 0.18/0.215/0.25 MG-25 MCG PO TABS: 1.0000 | ORAL_TABLET | Freq: Every day | ORAL | 4 refills | Status: DC

## 2018-03-29 ENCOUNTER — Encounter: Payer: BLUE CROSS/BLUE SHIELD | Admitting: Obstetrics and Gynecology

## 2018-04-04 ENCOUNTER — Encounter: Payer: BLUE CROSS/BLUE SHIELD | Admitting: Obstetrics and Gynecology

## 2018-04-30 ENCOUNTER — Other Ambulatory Visit: Payer: Self-pay | Admitting: Obstetrics and Gynecology

## 2018-07-12 ENCOUNTER — Encounter: Payer: Self-pay | Admitting: Obstetrics and Gynecology

## 2018-07-12 ENCOUNTER — Ambulatory Visit (INDEPENDENT_AMBULATORY_CARE_PROVIDER_SITE_OTHER): Payer: BLUE CROSS/BLUE SHIELD | Admitting: Obstetrics and Gynecology

## 2018-07-12 VITALS — BP 116/70 | HR 100 | Ht 64.5 in | Wt 142.5 lb

## 2018-07-12 DIAGNOSIS — Z01419 Encounter for gynecological examination (general) (routine) without abnormal findings: Secondary | ICD-10-CM | POA: Diagnosis not present

## 2018-07-12 NOTE — Patient Instructions (Signed)
Place annual gynecologic exam patient instructions here.

## 2018-07-12 NOTE — Progress Notes (Signed)
Subjective:   Erin JordanChristine H Gomez is a 47 y.o. [phone] Caucasian female here for a routine well-woman exam.  Patient's last menstrual period was 07/02/2018.    Current complaints: vaginal tear still bothers her occasional  PCP: ?         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  Past Medical History Past Medical History:  Diagnosis Date  . Arthritis   . Dyspnea    occasionally, may be disc related  . Heart palpitations   . History of ITP 10/14/2016  . History of miscarriage, currently pregnant   . Vaginal Pap smear, abnormal     Past Surgical History Past Surgical History:  Procedure Laterality Date  . CERVICAL CONE BIOPSY  2008  . DILATION AND EVACUATION N/A 01/16/2018   Procedure: DILATATION AND EVACUATION;  Surgeon: Herold Harmsefrancesco, Martin A, MD;  Location: [facility] ORS;  Service: Gynecology;  Laterality: N/A;  . LEEP    . TONSILLECTOMY    . WISDOM TOOTH EXTRACTION      Gynecologic History [id number]  Patient's last menstrual period was 07/02/2018. Contraception: OCP (estrogen/progesterone) and vasectomy Last Pap: 03/2017. Results were: normal   Obstetric History OB History  Gravida Para Term Preterm AB Living  10 1 1   8 1   SAB TAB Ectopic Multiple Live Births  8     0 1    # Outcome Date GA Lbr Len/2nd Weight Sex Delivery Anes PTL Lv  10 Gravida           9 Term 10/14/16 948w4d / 02:06 8 lb 12 oz (3.97 kg) F Vag-Spont None  LIV  8 SAB           7 SAB           6 SAB           5 SAB           4 SAB           3 SAB           2 SAB           1 SAB             Current  Medications Current Outpatient Medications on File Prior to Visit  Medication Sig Dispense Refill  . acyclovir (ZOVIRAX) 400 MG tablet TAKE 1 TABLET BY MOUTH TWICE A DAY 60 tablet 0  . ibuprofen (ADVIL,MOTRIN) 800 MG tablet Take 1 tablet (800 mg total) by mouth 3 (three) times daily. 30 tablet 1  . NON FORMULARY Take 15 mg by mouth at bedtime. CBD gummy at bedtime.    . Norethindrone-Ethinyl Estradiol-Fe Biphas (LO LOESTRIN FE) 1 MG-10 MCG / 10 MCG tablet Take 1 tablet by mouth daily. 3 Package 11  . Norgestimate-Ethinyl Estradiol Triphasic 0.18/0.215/0.25 MG-25 MCG tab Take 1 tablet by mouth daily. 3 Package 4   No current facility-administered medications on file prior to visit.     Review of Systems Patient denies any headaches, blurred vision, shortness of breath, chest pain, abdominal pain, problems with bowel movements, urination, or intercourse.  Objective:  BP 116/70   Pulse 100   Ht 5' 4.5" (  1.638 m)   Wt 142 lb 8 oz (64.6 kg)   LMP 07/02/2018   Breastfeeding? Unknown   BMI 24.08 kg/m  Physical Exam  General:  Well developed, well nourished, no acute distress. She is alert and oriented x3. Skin:  Warm and dry Neck:  Midline trachea, no thyromegaly or nodules Cardiovascular: Regular rate and rhythm, no murmur heard Lungs:  Effort normal, all lung fields clear to auscultation bilaterally Breasts:  No dominant palpable mass, retraction, or nipple discharge Abdomen:  Soft, non tender, no hepatosplenomegaly or masses Pelvic:  External genitalia is normal in appearance.  The vagina is normal in appearance. The cervix is bulbous, no CMT.  Thin prep pap is not done . Uterus is felt to be normal size, shape, and contour.  No adnexal masses or tenderness noted. 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

## 2018-07-30 IMAGING — CT CT HEAD W/O CM
3 series · 15 of 45 positions shown, 18 images · non-contrast
Comparison: None.

CLINICAL DATA: Pt to ed with c/o headache, blurred vision, numbness
in right hand and difficulty speaking at 715 am today. Pt states she
was driving down the road when the symptoms started. Pt reports all
symptoms are resolved at this time.

EXAM:
CT HEAD WITHOUT CONTRAST
TECHNIQUE: Contiguous axial images were obtained from the base of the skull
through the vertex without intravenous contrast.

[Series 3: head wo · axial · 0.39mm/px · z∈[-379,-264]mm · 9 of 28 slices shown, 12 images]
[im 3/28  brain]
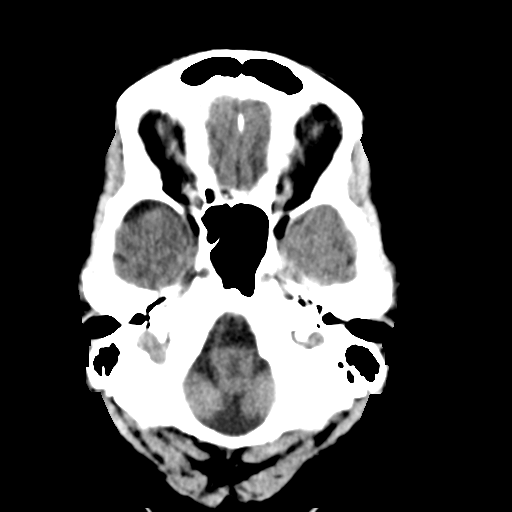
[im 3/28  bone]
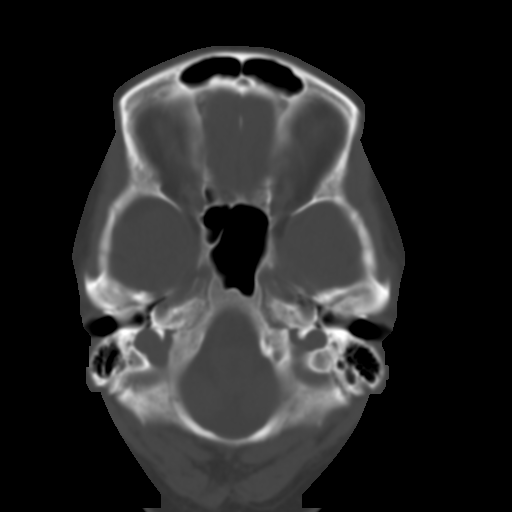
[im 6/28  brain]
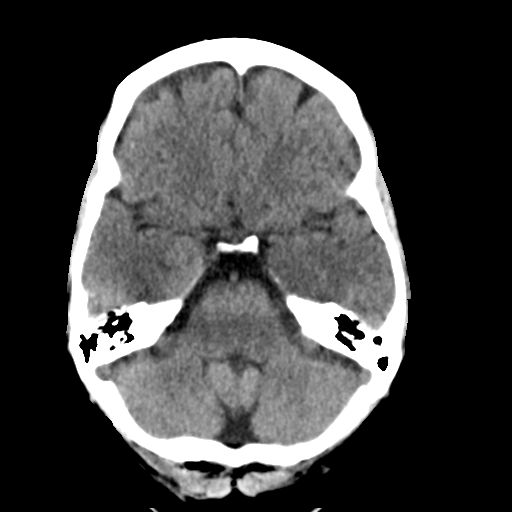
[im 9/28  brain]
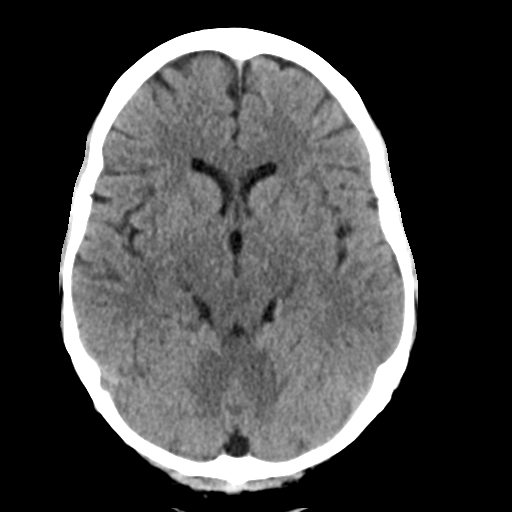
[im 12/28  brain]
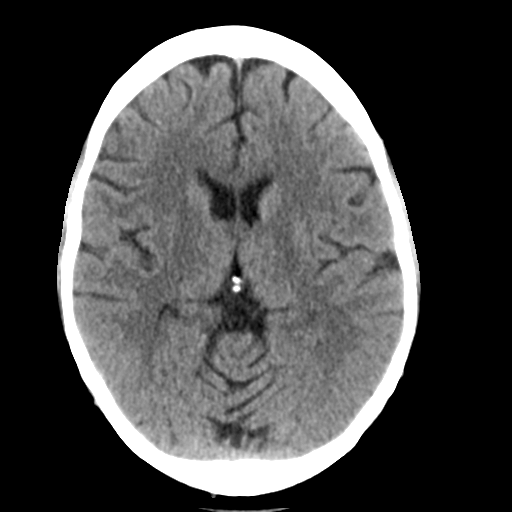
[im 15/28  brain]
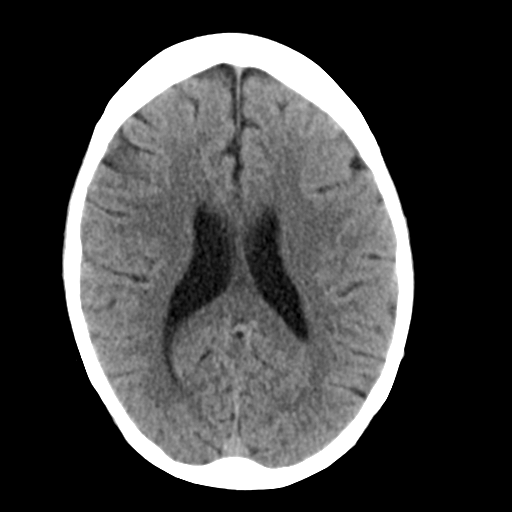
[im 15/28  bone]
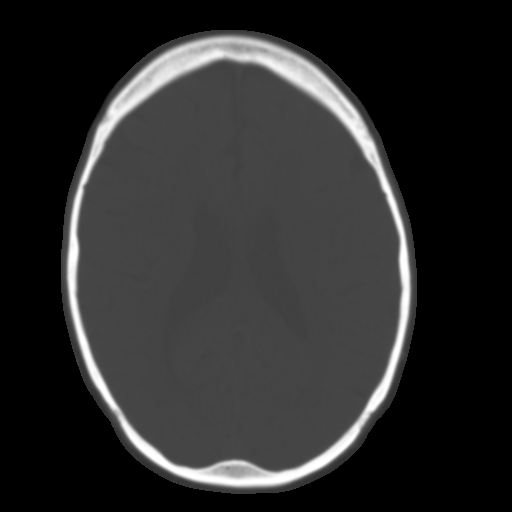
[im 17/28  brain]
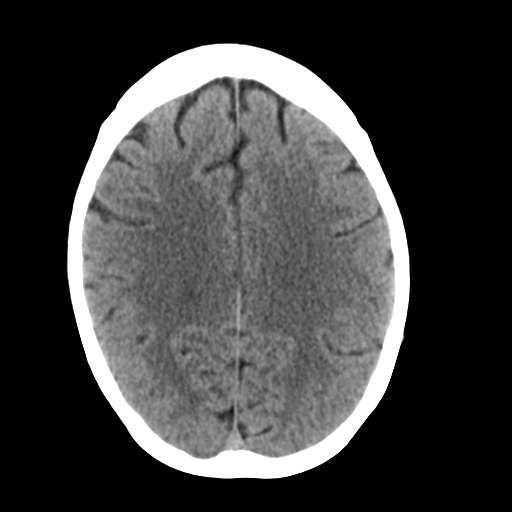
[im 20/28  brain]
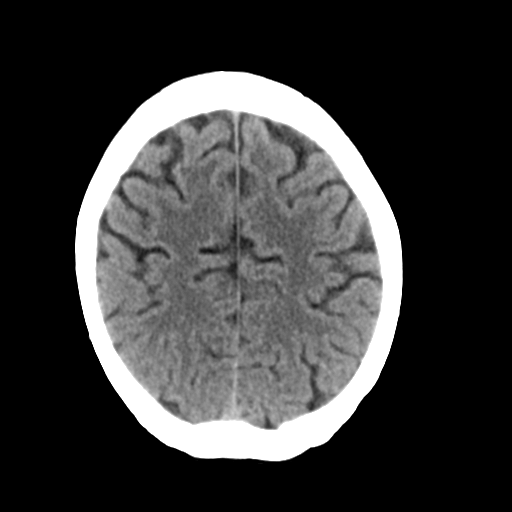
[im 23/28  brain]
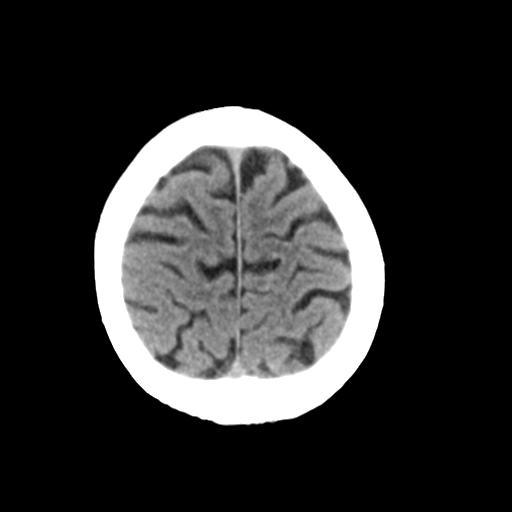
[im 26/28  brain]
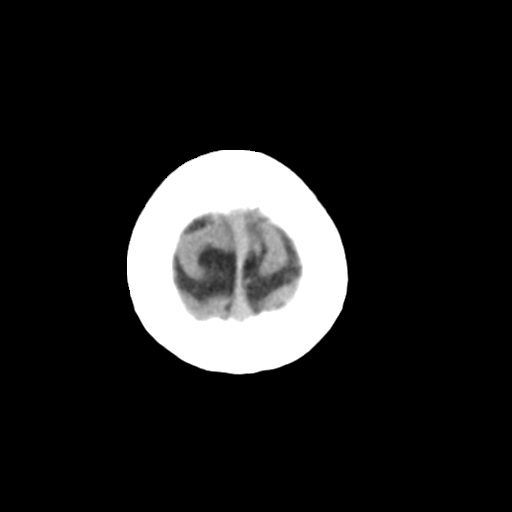
[im 26/28  bone]
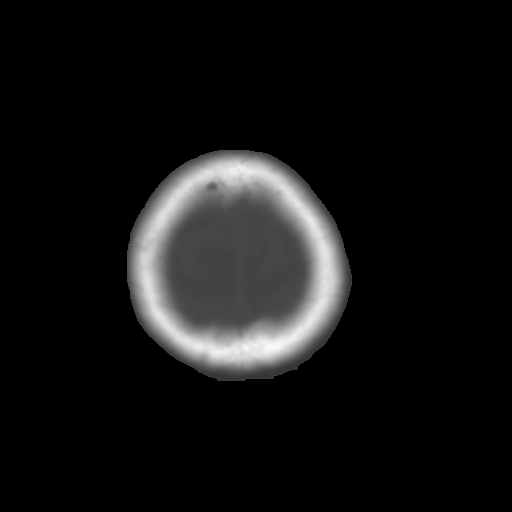

[Series 5: coronal soft tissue · coronal · 0.29mm/px · 3 of 67 slices shown]
[im 23/67  brain]
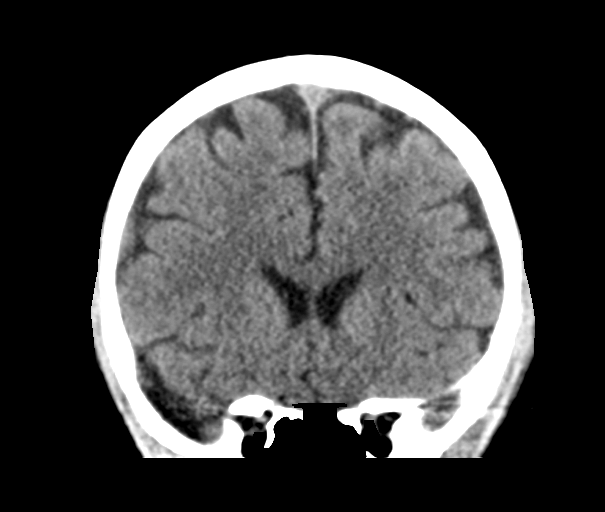
[im 30/67  brain]
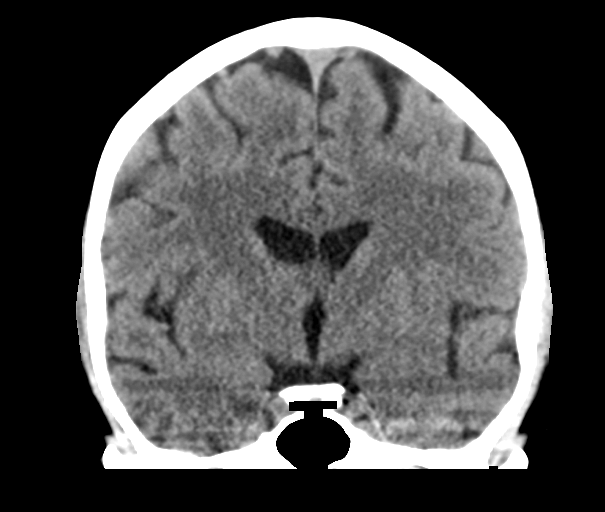
[im 37/67  brain]
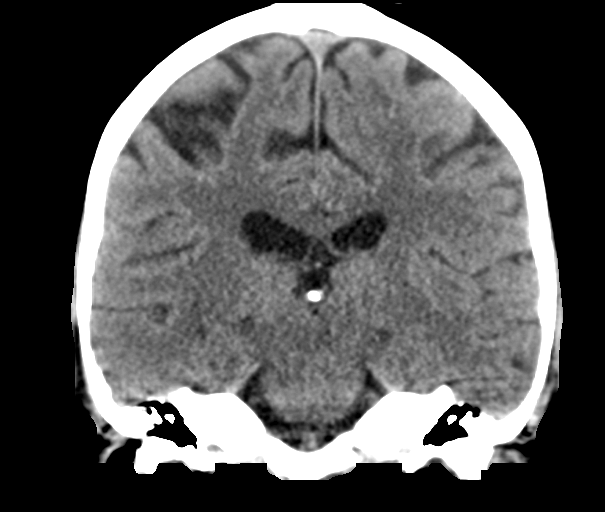

[Series 6: sagittal soft tissue · sagittal · 0.32mm/px · 3 of 49 slices shown]
[im 17/49  brain]
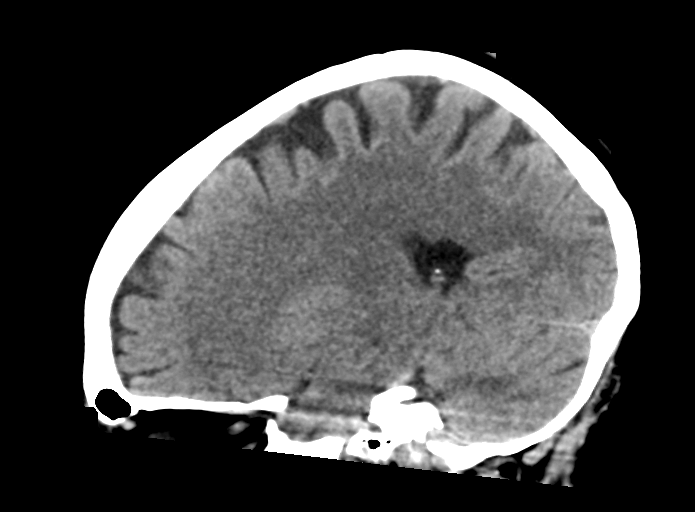
[im 25/49  brain]
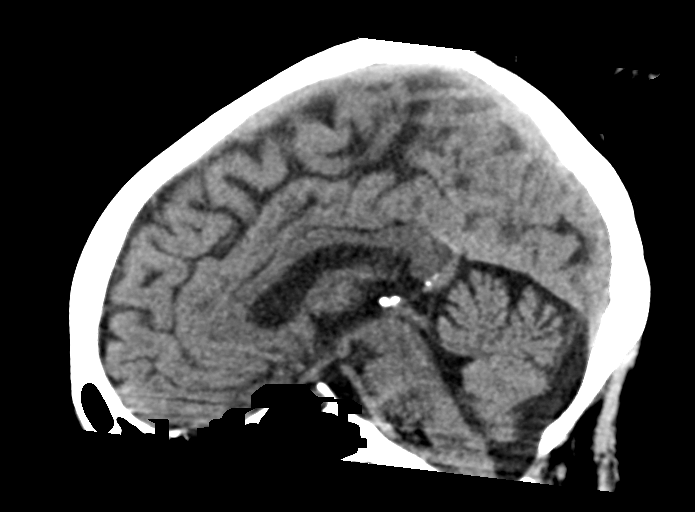
[im 33/49  brain]
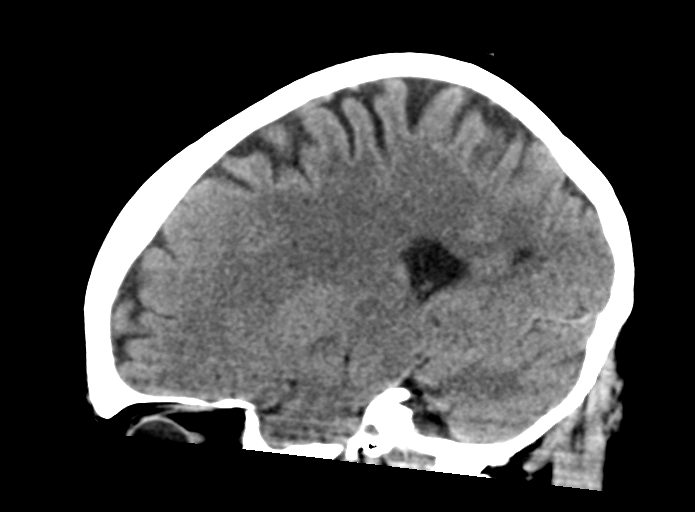

[15 of 45 positions shown; findings below may reference images not displayed]

FINDINGS: Brain: No acute intracranial hemorrhage. No focal mass lesion. No CT
evidence of acute infarction. No midline shift or mass effect. No
hydrocephalus. Basilar cisterns are patent.

Vascular: Unremarkable

Skull: Unremarkable

Sinuses/Orbits: Paranasal sinuses and mastoid air cells are clear.
Orbits are clear.
IMPRESSION: Normal head CT.

## 2018-08-17 ENCOUNTER — Other Ambulatory Visit: Payer: Self-pay | Admitting: Obstetrics and Gynecology

## 2018-09-21 ENCOUNTER — Ambulatory Visit: Payer: BLUE CROSS/BLUE SHIELD | Admitting: Family Medicine

## 2018-09-21 ENCOUNTER — Encounter: Payer: Self-pay | Admitting: Family Medicine

## 2018-09-21 VITALS — BP 103/68 | HR 77 | Temp 98.2°F | Wt 141.2 lb

## 2018-09-21 DIAGNOSIS — Z862 Personal history of diseases of the blood and blood-forming organs and certain disorders involving the immune mechanism: Secondary | ICD-10-CM

## 2018-09-21 DIAGNOSIS — T148XXA Other injury of unspecified body region, initial encounter: Secondary | ICD-10-CM

## 2018-09-21 NOTE — Progress Notes (Signed)
Patient: Erin Gomez Female    DOB: 07/16/1971   47 y.o.   MRN: [id number] Visit Date: 09/21/2018  Today's Provider: Shirlee Latch, MD   Chief Complaint  Patient presents with  . Labs   Subjective:    I, Presley Raddle, CMA, am acting as a scribe for Shirlee Latch, MD.   HPI Patient presents today to request a CBC be completed. She has a history of ITP.  She has notcied some petechiae and extra bruising recently.    Allergies  Allergen Reactions  . Adhesive [Tape] Rash     Current Outpatient Medications:  .  acyclovir (ZOVIRAX) 400 MG tablet, TAKE 1 TABLET BY MOUTH TWICE A DAY, Disp: 60 tablet, Rfl: 4 .  meloxicam (MOBIC) 15 MG tablet, Take 15 mg by mouth daily., Disp: , Rfl:  .  NON FORMULARY, Take 15 mg by mouth at bedtime. CBD gummy at bedtime., Disp: , Rfl:  .  Norgestimate-Ethinyl Estradiol Triphasic 0.18/0.215/0.25 MG-25 MCG tab, Take 1 tablet by mouth daily., Disp: 3 Package, Rfl: 4 .  omeprazole (PRILOSEC) 20 MG capsule, , Disp: , Rfl: 0 .  ibuprofen (ADVIL,MOTRIN) 800 MG tablet, Take 1 tablet (800 mg total) by mouth 3 (three) times daily. (Patient not taking: Reported on 07/12/2018), Disp: 30 tablet, Rfl: 1  Review of Systems  Constitutional: Negative.   Respiratory: Negative.   Cardiovascular: Negative.   Musculoskeletal: Negative.     Social History   Tobacco Use  . Smoking status: Never Smoker  . Smokeless tobacco: Never Used  Substance Use Topics  . Alcohol use: Yes    Comment: occasional glass of wine   Objective:   BP 103/68 (BP Location: Right Arm, Patient Position: Sitting, Cuff Size: Normal)   Pulse 77   Temp 98.2 F (36.8 C) (Oral)   Wt 141 lb 3.2 oz (64 kg)   SpO2 95%   BMI 23.86 kg/m  Vitals:   09/21/18 1141  BP: 103/68  Pulse: 77  Temp: 98.2 F (36.8 C)  TempSrc: Oral  SpO2: 95%  Weight: 141 lb 3.2 oz (64 kg)     Physical Exam  Constitutional: She is oriented to person, place, and time. She appears  well-developed and well-nourished. No distress.  HENT:  Head: Normocephalic and atraumatic.  Mouth/Throat: Oropharynx is clear and moist.  Eyes: Conjunctivae are normal. No scleral icterus.  Neck: Neck supple. No thyromegaly present.  Cardiovascular: Normal rate, regular rhythm, normal heart sounds and intact distal pulses.  No murmur heard. Pulmonary/Chest: Effort normal and breath sounds normal. No respiratory distress. She has no wheezes. She has no rales.  Abdominal: Soft. She exhibits no distension. There is no tenderness.  Musculoskeletal: She exhibits no edema.  Lymphadenopathy:    She has no cervical adenopathy.  Neurological: She is alert and oriented to person, place, and time.  Skin: Skin is warm and dry. Capillary refill takes less than 2 seconds. No rash noted.  Few bruises on legs.  No petechiae  Psychiatric: She has a normal mood and affect. Her behavior is normal.  Vitals reviewed.       Assessment & Plan:   1. History of ITP 2. Bruising - last CBC wnl, but patient has h/o ITP - doubt she is thrombocytopenic currently, but will recheck CBC - CBC w/Diff/Platelet   Return if symptoms worsen or fail to improve.   The entirety of the information documented in the History of Present Illness, Review of Systems and Physical  Exam were personally obtained by me. Portions of this information were initially documented by Presley Raddle, CMA and reviewed by me for thoroughness and accuracy.    Erasmo Downer, MD, MPH [facility] 09/21/2018 11:57 AM

## 2018-09-22 LAB — CBC WITH DIFFERENTIAL/PLATELET
BASOS: 1 %
Basophils Absolute: 0 10*3/uL (ref 0.0–0.2)
EOS (ABSOLUTE): 0.1 10*3/uL (ref 0.0–0.4)
EOS: 3 %
HEMATOCRIT: 37.3 % (ref 34.0–46.6)
Hemoglobin: 12.3 g/dL (ref 11.1–15.9)
IMMATURE GRANULOCYTES: 0 %
Immature Grans (Abs): 0 10*3/uL (ref 0.0–0.1)
Lymphocytes Absolute: 0.8 10*3/uL (ref 0.7–3.1)
Lymphs: 16 %
MCH: 29.4 pg (ref 26.6–33.0)
MCHC: 33 g/dL (ref 31.5–35.7)
MCV: 89 fL (ref 79–97)
MONOS ABS: 0.4 10*3/uL (ref 0.1–0.9)
Monocytes: 8 %
NEUTROS ABS: 3.9 10*3/uL (ref 1.4–7.0)
Neutrophils: 72 %
Platelets: 182 10*3/uL (ref 150–450)
RBC: 4.18 x10E6/uL (ref 3.77–5.28)
RDW: 11.8 % — ABNORMAL LOW (ref 12.3–15.4)
WBC: 5.3 10*3/uL (ref 3.4–10.8)

## 2018-09-24 ENCOUNTER — Telehealth: Payer: Self-pay

## 2018-09-24 NOTE — Telephone Encounter (Signed)
-----   Message from Erasmo Downer, MD sent at 09/24/2018  9:09 AM EST ----- Normal blood counts, including platelets.  Platelets are slightly lower than when last checked, but patient seems to range from 180s-220  Erasmo Downer, MD, MPH [facility] 09/24/2018 9:09 AM

## 2018-09-24 NOTE — Telephone Encounter (Signed)
Pt returned missed call. 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 Please call pt back after 1:30 pm.  Thanks, [facility]

## 2018-09-24 NOTE — Telephone Encounter (Signed)
Left message to call back  

## 2018-09-25 NOTE — Telephone Encounter (Signed)
Patient advised.

## 2018-10-08 ENCOUNTER — Encounter: Payer: Self-pay | Admitting: Family Medicine

## 2018-10-08 ENCOUNTER — Ambulatory Visit: Payer: BLUE CROSS/BLUE SHIELD | Admitting: Family Medicine

## 2018-10-08 VITALS — BP 100/60 | HR 76 | Temp 98.3°F | Resp 16 | Wt 146.2 lb

## 2018-10-08 DIAGNOSIS — J069 Acute upper respiratory infection, unspecified: Secondary | ICD-10-CM | POA: Diagnosis not present

## 2018-10-08 NOTE — Progress Notes (Signed)
  Subjective:     Patient ID: Erin Gomez, female   DOB: 04/24/1971, 47 y.o.   MRN: [id number] Chief Complaint  Patient presents with  . URI    Patient comes in office today with complaints of cough and congestion for the past 24hrs. Patient reports the following symptoms; cough, congestion, post nasal drip, sinus pain and sneezing. Patient has tried taking otc Nyquil.    HPI States her 47 year old daughter has been ill for 2-3 weeks and placed on abx for a sinusitis. Denies fever/chills.  Review of Systems     Objective:   Physical Exam  Constitutional: She appears well-developed and well-nourished. No distress.  Ears: T.M's intact without inflammation Throat: tonsils absent Neck: no cervical adenopathy Lungs: clear     Assessment:    1. URI, acute    Plan:    Discussed sx treatment. Call inf sinuses not improved in a week.

## 2018-10-08 NOTE — Patient Instructions (Signed)
Discussed use of Mucinex D for congestion, Delsym for cough, and Benadryl for postnasal drainage. Let me know if sinuses not improving next Monday.

## 2018-10-15 ENCOUNTER — Ambulatory Visit: Payer: BLUE CROSS/BLUE SHIELD | Attending: Physician Assistant | Admitting: Speech Pathology

## 2018-10-15 ENCOUNTER — Other Ambulatory Visit: Payer: Self-pay

## 2018-10-15 ENCOUNTER — Encounter: Payer: Self-pay | Admitting: Speech Pathology

## 2018-10-15 DIAGNOSIS — R490 Dysphonia: Secondary | ICD-10-CM | POA: Insufficient documentation

## 2018-10-15 NOTE — Therapy (Signed)
[facility] MAIN [facility] SERVICES [address] [place], [place], [address] Phone: [phone]   Fax:  [phone]  Speech Language Pathology Evaluation  Patient Details  Name: Erin Gomez MRN: [id number] Date of Birth: Nov 15, 1970 Referring Provider (SLP): Meta Hatchet ANN    Encounter Date: 10/15/2018  End of Session - 10/15/18 1543    Visit Number  1    Number of Visits  9    Date for SLP Re-Evaluation  11/15/18       Past Medical History:  Diagnosis Date  . Arthritis   . Dyspnea    occasionally, may be disc related  . Heart palpitations   . History of ITP 10/14/2016  . History of miscarriage, currently pregnant   . Vaginal Pap smear, abnormal     Past Surgical History:  Procedure Laterality Date  . CERVICAL CONE BIOPSY  2008  . DILATION AND EVACUATION N/A 01/16/2018   Procedure: DILATATION AND EVACUATION;  Surgeon: Herold Harms, MD;  Location: [facility] ORS;  Service: Gynecology;  Laterality: N/A;  . LEEP    . TONSILLECTOMY    . WISDOM TOOTH EXTRACTION      There were no vitals filed for this visit.      SLP Evaluation [facility] - 10/15/18 0001      SLP Visit Information   SLP Received On  10/15/18    Referring Provider (SLP)  Meta Hatchet ANN     Onset Date  08/24/2018    Medical Diagnosis  Dysphonia      Subjective   Subjective  The patient sings professionally    Patient/Family Stated Goal  "To be able to sing to my daughter"      General Information   HPI  47 year old woman with 4 year history of on and off dysphonia referred for voice therapy.  Per ENT report, abnormal findings include posterior VC gap with phonation.      Prior Functional Status   Cognitive/Linguistic Baseline  Within functional limits      Oral Motor/Sensory Function   Overall Oral Motor/Sensory Function  Appears within functional limits for tasks assessed      Motor Speech   Overall Motor Speech  Impaired    Respiration   Impaired    Level of Impairment  Conversation    Phonation  Breathy;Hoarse;Low vocal intensity    Resonance  Within functional limits    Articulation  Within functional limitis    Intelligibility  Intelligible    Phonation  Impaired    Vocal Abuses  Habitual Hyperphonia;Prolonged Vocal Use;Vocal Fold Dehydration    Tension Present  Jaw;Neck;Shoulder    Volume  Soft    Pitch  Low      Standardized Assessments   Standardized Assessments   Other Assessment   Perceptual Voice Evaluation      Perceptual Voice Evaluation Voice checklist: . Health risks: GERD, allergies  . 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 Environmental risks: no significant environmental risks . Misuse: excessive talking, speaking without good breath support . Abuse: throat clearing . Vocal characteristics: breathy, limited voice range, poor vocal projection, excessive pharyngeal resonance Maximum phonation time for sustained "ah": 10 seconds Average fundamental frequency during sustained "ah": 201 Hz (1.6 STD below average for gender) Habitual pitch: 153 Hz Highest dynamic pitch when altering pitch from a low note to a high note: 620 Hz Lowest dynamic pitch when altering from a high note to a low note:  126 Hz Average time patient was able to sustain /s/: 32 seconds Average time patient was able to sustain /z/: 11.7 seconds s/z ratio : 2.7 Visi-Pitch: Multi-Dimensional Voice Program (MDVP)  MDVPT extracts objective quantitative values (Relative Average Perturbation, Shimmer, Voice Turbulence Index, and Noise to Harmonic Ratio) on sustained phonation, which are displayed graphically and numerically in comparison to a built-in normative database.  The patient exhibited values outside the norm for Relative Average Perturbation.  Average fundamental frequency was 1.6 STD below average for age and gender.  Patient able to improve all parameters with louder, higher pitched  voice. Education: Patient instructed in extrinsic laryngeal muscle stretches and breath support exercises  SLP Education - 10/15/18 1543    Education Details  Voice production, neck/tongue/throat stretches, straw phonation, trills    Person(s) Educated  Patient    Methods  Explanation    Comprehension  Verbalized understanding         SLP Long Term Goals - 10/15/18 1545      SLP LONG TERM GOAL #1   Title  The patient will demonstrate independent understanding of vocal hygiene concepts and extrinsic laryngeal muscle stretches.      Time  4    Period  Weeks    Status  New    Target Date  11/15/18      SLP LONG TERM GOAL #2   Title  The patient will be independent for abdominal breathing and breath support exercises.    Time  4    Period  Weeks    Status  New    Target Date  11/15/18      SLP LONG TERM GOAL #3   Title  The patient will minimize vocal tension via resonant voice therapy (or comparable technique) with min SLP cues with 80% accuracy.    Time  4    Period  Weeks    Status  New    Target Date  11/15/18      SLP LONG TERM GOAL #4   Title  The patient will maximize voice quality and loudness using breath support/oral resonance for paragraph length recitation with 80% accuracy.    Time  4    Period  Weeks    Status  New    Target Date  11/15/18       Plan - 10/15/18 1544    Clinical Impression Statement  This 47 year old woman under the care of Meta Hatchet, PA-C, with posterior vocal cord gap with phonation, is presenting with moderate dysphonia.  The patient demonstrates hoarse vocal quality, reduced breath control for speech, excessive pharyngeal resonance, strained/tense phonation, limited pitch range, vocal fatigue, and laryngeal tension. She will benefit from voice therapy for education, to improve breath support, improve tone focus, promote easy flow phonation, and learn techniques to increase loudness and pitch range without strain.    Speech Therapy  Frequency  2x / week    Duration  4 weeks    Treatment/Interventions  Patient/family education;Other (comment)   Voice therapy   Potential to Achieve Goals  Good    Potential Considerations  Ability to learn/carryover information;Pain level;Family/community support;Co-morbidities;Previous level of function;Cooperation/participation level;Severity of impairments;Medical prognosis    SLP Home Exercise Plan  Provided    Consulted and Agree with Plan of Care  Patient       Patient will benefit from skilled therapeutic intervention in order to improve the following deficits and impairments:   Dysphonia - Plan: SLP plan of care cert/re-cert  Problem List Patient Active Problem List   Diagnosis Date Noted  . Shortness of breath 07/07/2017  . Peripheral neuropathy 07/07/2017  . Fatigue 07/07/2017  . Chest pain 07/07/2017  . Fx sacrum/coccyx-closed (HCC) 11/02/2016  . Laceration of labial vestibule 10/25/2016  . History of ITP 10/14/2016  . Atypical migraine 07/13/2016  . Herpes genitalis in women 04/20/2016   [name], MS/CCC- SLP  [name] 10/15/2018, 3:52 PM  [facility] MAIN [facility] SERVICES [address] [place], [place], [address] Phone: [phone]   Fax:  [phone]  Name: Erin Gomez MRN: [id number] Date of Birth: March 05, 1971

## 2018-10-17 ENCOUNTER — Ambulatory Visit: Payer: BLUE CROSS/BLUE SHIELD | Admitting: Speech Pathology

## 2018-10-17 ENCOUNTER — Encounter: Payer: Self-pay | Admitting: Speech Pathology

## 2018-10-17 DIAGNOSIS — R490 Dysphonia: Secondary | ICD-10-CM | POA: Diagnosis not present

## 2018-10-17 NOTE — Therapy (Signed)
[facility] MAIN [facility] SERVICES [address] [place], [place], [address] Phone: [phone]   Fax:  [phone]  Speech Language Pathology Treatment  Patient Details  Name: Erin Gomez MRN: [id number] Date of Birth: Jun 14, 1971 Referring Provider (SLP): Meta Hatchet ANN    Encounter Date: 10/17/2018  End of Session - 10/17/18 1025    Visit Number  2    Number of Visits  9    Date for SLP Re-Evaluation  11/15/18    Activity Tolerance  Patient tolerated treatment well       Past Medical History:  Diagnosis Date  . Arthritis   . Dyspnea    occasionally, may be disc related  . Heart palpitations   . History of ITP 10/14/2016  . History of miscarriage, currently pregnant   . Vaginal Pap smear, abnormal     Past Surgical History:  Procedure Laterality Date  . CERVICAL CONE BIOPSY  2008  . DILATION AND EVACUATION N/A 01/16/2018   Procedure: DILATATION AND EVACUATION;  Surgeon: Herold Harms, MD;  Location: [facility] ORS;  Service: Gynecology;  Laterality: N/A;  . LEEP    . TONSILLECTOMY    . WISDOM TOOTH EXTRACTION      There were no vitals filed for this visit.  Subjective Assessment - 10/17/18 1024    Subjective  "I just can't go of the tension"            ADULT SLP TREATMENT - 10/17/18 0001      General Information   Behavior/Cognition  Alert;Cooperative;Pleasant mood    HPI  47 year old woman with 4 year history of on and off dysphonia referred for voice therapy.  Per ENT report, abnormal findings include posterior VC gap with phonation.   The patient is a [employer] representative.      Treatment Provided   Treatment provided  Cognitive-Linquistic      Pain Assessment   Pain Assessment  No/denies pain      Cognitive-Linquistic Treatment   Treatment focused on  Voice    Skilled Treatment  The patient was provided with written and verbal teaching regarding vocal hygiene.  The patient was provided with written and  verbal teaching regarding neck, tongue, and throat stretches exercises to promote relaxed phonation. The patient was provided with written and verbal teaching for supplemental vocal tract relaxation exercises (straw phonation, trills).  The patient was provided with written and verbal teaching regarding breath support exercises.  The patient was provided with verbal, written and recorded instruction in resonant voice exercises:  Hum- Sustained, Hum- Siren, hum- Vowels, Hum- Descending glides, Hum- Ascending glides, Hum- word level.  Patient instructed in semi-occluded vocal tract modification (/w/) and vocal loudness/breath support to decrease vocal strain.      Assessment / Recommendations / Plan   Plan  Continue with current plan of care      Progression Toward Goals   Progression toward goals  Progressing toward goals       SLP Education - 10/17/18 1025    Education Details  Effect of muscle tension on vocal quality and pitch range    Person(s) Educated  Patient    Methods  Explanation    Comprehension  Verbalized understanding         SLP Long Term Goals - 10/15/18 1545      SLP LONG TERM GOAL #1   Title  The patient will demonstrate independent understanding of vocal hygiene concepts and extrinsic laryngeal muscle stretches.  Time  4    Period  Weeks    Status  New    Target Date  11/15/18      SLP LONG TERM GOAL #2   Title  The patient will be independent for abdominal breathing and breath support exercises.    Time  4    Period  Weeks    Status  New    Target Date  11/15/18      SLP LONG TERM GOAL #3   Title  The patient will minimize vocal tension via resonant voice therapy (or comparable technique) with min SLP cues with 80% accuracy.    Time  4    Period  Weeks    Status  New    Target Date  11/15/18      SLP LONG TERM GOAL #4   Title  The patient will maximize voice quality and loudness using breath support/oral resonance for paragraph length recitation with  80% accuracy.    Time  4    Period  Weeks    Status  New    Target Date  11/15/18       Plan - 10/17/18 1025    Clinical Impression Statement  Patient able to improve vocal quality with nasality/semi-occluded vocal tract to improve oral resonance and loudness to decrease laryngeal strain.  As a Holiday representativeprofessional singer, the patient has excellent insight.    Speech Therapy Frequency  2x / week    Duration  4 weeks    Treatment/Interventions  Patient/family education;Other (comment)   Voice therapy   Potential to Achieve Goals  Good    Potential Considerations  Ability to learn/carryover information;Pain level;Family/community support;Co-morbidities;Previous level of function;Cooperation/participation level;Severity of impairments;Medical prognosis    SLP Home Exercise Plan  Provided    Consulted and Agree with Plan of Care  Patient       Patient will benefit from skilled therapeutic intervention in order to improve the following deficits and impairments:   Dysphonia    Problem List Patient Active Problem List   Diagnosis Date Noted  . Shortness of breath 07/07/2017  . Peripheral neuropathy 07/07/2017  . Fatigue 07/07/2017  . Chest pain 07/07/2017  . Fx sacrum/coccyx-closed (HCC) 11/02/2016  . Laceration of labial vestibule 10/25/2016  . History of ITP 10/14/2016  . Atypical migraine 07/13/2016  . Herpes genitalis in women 04/20/2016   Dollene PrimroseSusan G Quayshawn Nin, MS/CCC- SLP  Leandrew KoyanagiAbernathy, Susie 10/17/2018, 10:27 AM  [facility] [address] [place], [place], [address] Phone: [phone]   Fax:  [phone]   Name: Erin Gomez MRN: [id number] Date of Birth: 01/31/1971

## 2018-10-18 ENCOUNTER — Telehealth: Payer: Self-pay | Admitting: Family Medicine

## 2018-10-18 ENCOUNTER — Other Ambulatory Visit: Payer: Self-pay | Admitting: Family Medicine

## 2018-10-18 MED ORDER — AMOXICILLIN-POT CLAVULANATE 875-125 MG PO TABS: 1.0000 | ORAL_TABLET | Freq: Two times a day (BID) | ORAL | 0 refills | Status: DC

## 2018-10-18 NOTE — Telephone Encounter (Signed)
Pt came in on 11-25.  Pt is not feeling better.  Pt needing to discuss what Nadine CountsBob would recommend. 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  Please advise.  Thanks, Bed Bath [employer] BeyondGH

## 2018-10-18 NOTE — Telephone Encounter (Signed)
Please review

## 2018-10-18 NOTE — Telephone Encounter (Signed)
Reports persistent purulent sinus drainage @ day #11 of URI. Will send in Augmentin to cover for sinus infection.

## 2018-10-22 ENCOUNTER — Ambulatory Visit: Payer: BLUE CROSS/BLUE SHIELD | Admitting: Speech Pathology

## 2018-10-22 ENCOUNTER — Encounter: Payer: Self-pay | Admitting: Speech Pathology

## 2018-10-22 DIAGNOSIS — R490 Dysphonia: Secondary | ICD-10-CM

## 2018-10-22 NOTE — Therapy (Signed)
[facility] [address] [place], [place], [address] Phone: [phone]   Fax:  [phone]  Speech Language Pathology Treatment  Patient Details  Name: Jonathon JordanChristine H Mink MRN: [id number] Date of Birth: 07/21/1971 Referring Provider (SLP): Meta HatchetBROOKS, JENNIFER ANN    Encounter Date: 10/22/2018  End of Session - 10/22/18 1606    Visit Number  3    Number of Visits  9    Date for SLP Re-Evaluation  11/15/18    SLP Start Time  0900    SLP Stop Time   0956    SLP Time Calculation (min)  56 min    Activity Tolerance  Patient tolerated treatment well       Past Medical History:  Diagnosis Date  . Arthritis   . Dyspnea    occasionally, may be disc related  . Heart palpitations   . History of ITP 10/14/2016  . History of miscarriage, currently pregnant   . Vaginal Pap smear, abnormal     Past Surgical History:  Procedure Laterality Date  . CERVICAL CONE BIOPSY  2008  . DILATION AND EVACUATION N/A 01/16/2018   Procedure: DILATATION AND EVACUATION;  Surgeon: Herold Harmsefrancesco, Martin A, MD;  Location: [facility] ORS;  Service: Gynecology;  Laterality: N/A;  . LEEP    . TONSILLECTOMY    . WISDOM TOOTH EXTRACTION      There were no vitals filed for this visit.  Subjective Assessment - 10/22/18 1605    Subjective  The patient reports that she still has a sinus infection            ADULT SLP TREATMENT - 10/22/18 0001      General Information   Behavior/Cognition  Alert;Cooperative;Pleasant mood    HPI  47 year old woman with 4 year history of on and off dysphonia referred for voice therapy.  Per ENT report, abnormal findings include posterior VC gap with phonation.   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      Treatment Provided   Treatment provided  Cognitive-Linquistic      Pain Assessment   Pain Assessment  No/denies pain      Cognitive-Linquistic Treatment   Treatment focused on  Voice    Skilled Treatment  The patient  was provided with written and verbal teaching regarding vocal hygiene.  The patient was provided with written and verbal teaching regarding neck, tongue, and throat stretches exercises to promote relaxed phonation. The patient was provided with written and verbal teaching for supplemental vocal tract relaxation exercises (straw phonation, trills).  The patient was provided with written and verbal teaching regarding breath support exercises.  The patient was provided with verbal, written and recorded instruction in resonant voice exercises:  Hum- Sustained, Hum- Siren, hum- Vowels, Hum- Descending glides, Hum- Ascending glides, Hum- word level.  Patient instructed in tongue-out hum modification, semi-occluded vocal tract modification (/w/), and vocal loudness/breath support to decrease vocal strain.        Assessment / Recommendations / Plan   Plan  Continue with current plan of care      Progression Toward Goals   Progression toward goals  Progressing toward goals       SLP Education - 10/22/18 1605    Education Details  Effect of muscle tension on vocal quality and pitch control    Person(s) Educated  Patient    Methods  Explanation    Comprehension  Verbalized understanding         SLP Long  Term Goals - 10/15/18 1545      SLP LONG TERM GOAL #1   Title  The patient will demonstrate independent understanding of vocal hygiene concepts and extrinsic laryngeal muscle stretches.      Time  4    Period  Weeks    Status  New    Target Date  11/15/18      SLP LONG TERM GOAL #2   Title  The patient will be independent for abdominal breathing and breath support exercises.    Time  4    Period  Weeks    Status  New    Target Date  11/15/18      SLP LONG TERM GOAL #3   Title  The patient will minimize vocal tension via resonant voice therapy (or comparable technique) with min SLP cues with 80% accuracy.    Time  4    Period  Weeks    Status  New    Target Date  11/15/18      SLP LONG  TERM GOAL #4   Title  The patient will maximize voice quality and loudness using breath support/oral resonance for paragraph length recitation with 80% accuracy.    Time  4    Period  Weeks    Status  New    Target Date  11/15/18       Plan - 10/22/18 1606    Clinical Impression Statement  Patient able to improve vocal quality with nasality/semi-occluded vocal tract to improve oral resonance and loudness to decrease laryngeal strain.  As a [employer] representative, the patient has excellent insight.  The patient is gaining control of pitch range and improved speaking vocal quality in resonant voice exercises.    Speech Therapy Frequency  2x / week    Duration  4 weeks    Treatment/Interventions  Patient/family education;Other (comment)   Voice therapy   Potential to Achieve Goals  Good    Potential Considerations  Ability to learn/carryover information;Pain level;Family/community support;Co-morbidities;Previous level of function;Cooperation/participation level;Severity of impairments;Medical prognosis    SLP Home Exercise Plan  Provided    Consulted and Agree with Plan of Care  Patient       Patient will benefit from skilled therapeutic intervention in order to improve the following deficits and impairments:   Dysphonia    Problem List Patient Active Problem List   Diagnosis Date Noted  . Shortness of breath 07/07/2017  . Peripheral neuropathy 07/07/2017  . Fatigue 07/07/2017  . Chest pain 07/07/2017  . Fx sacrum/coccyx-closed (HCC) 11/02/2016  . Laceration of labial vestibule 10/25/2016  . History of ITP 10/14/2016  . Atypical migraine 07/13/2016  . Herpes genitalis in women 04/20/2016   [name], MS/CCC- SLP  [name] 10/22/2018, 4:07 PM  [place] [facility] MAIN [facility] SERVICES [address] [place], [place], [address] Phone: [phone]   Fax:  [phone]   Name: MONSERRATH JUNIO MRN: [id number] Date of Birth:  1971/09/03

## 2018-10-24 ENCOUNTER — Encounter: Payer: Self-pay | Admitting: Speech Pathology

## 2018-10-24 ENCOUNTER — Ambulatory Visit: Payer: BLUE CROSS/BLUE SHIELD | Admitting: Speech Pathology

## 2018-10-24 DIAGNOSIS — R490 Dysphonia: Secondary | ICD-10-CM

## 2018-10-24 NOTE — Therapy (Signed)
[place] [facility] MAIN [facility] SERVICES [address] [place], [place], [address] Phone: [phone]   Fax:  [phone]  Speech Language Pathology Treatment  Patient Details  Name: Erin Gomez MRN: [id number] Date of Birth: 1970-12-26 Referring Provider (SLP): Meta Hatchet ANN    Encounter Date: 10/24/2018  End of Session - 10/24/18 1115    Visit Number  4    Number of Visits  9    Date for SLP Re-Evaluation  11/15/18    SLP Start Time  0915    SLP Stop Time   1000    SLP Time Calculation (min)  45 min    Activity Tolerance  Patient tolerated treatment well       Past Medical History:  Diagnosis Date  . Arthritis   . Dyspnea    occasionally, may be disc related  . Heart palpitations   . History of ITP 10/14/2016  . History of miscarriage, currently pregnant   . Vaginal Pap smear, abnormal     Past Surgical History:  Procedure Laterality Date  . CERVICAL CONE BIOPSY  2008  . DILATION AND EVACUATION N/A 01/16/2018   Procedure: DILATATION AND EVACUATION;  Surgeon: Herold Harms, MD;  Location: [facility] ORS;  Service: Gynecology;  Laterality: N/A;  . LEEP    . TONSILLECTOMY    . WISDOM TOOTH EXTRACTION      There were no vitals filed for this visit.  Subjective Assessment - 10/24/18 1113    Subjective  The patient reports improved vocal endurance for singing at an event            ADULT SLP TREATMENT - 10/24/18 0001      General Information   Behavior/Cognition  Alert;Cooperative;Pleasant mood    HPI  47 year old woman with 4 year history of on and off dysphonia referred for voice therapy.  Per ENT report, abnormal findings include posterior VC gap with phonation.   The patient is a [employer] representative.      Treatment Provided   Treatment provided  Cognitive-Linquistic      Pain Assessment   Pain Assessment  No/denies pain      Cognitive-Linquistic Treatment   Treatment focused on  Voice    Skilled Treatment   The patient was provided with written and verbal teaching regarding vocal hygiene.  The patient was provided with written and verbal teaching regarding neck, tongue, and throat stretches exercises to promote relaxed phonation. The patient was provided with written and verbal teaching for supplemental vocal tract relaxation exercises (straw phonation, trills).  The patient was provided with written and verbal teaching regarding breath support exercises.  The patient was provided with verbal, written and recorded instruction in resonant voice exercises:  Hum- Sustained, Hum- Siren, hum- Vowels, Hum- Descending glides, Hum- Ascending glides, Hum- word level.  Patient instructed in tongue-out hum modification, semi-occluded vocal tract modification (/w/), and vocal loudness/breath support to decrease vocal strain.  Patient initially with moderately hoarse voice.  Patient able to use stretching and vocal warm-up to improve vocal quality and generate clear vocal quality in conversation with 70% accuracy.      Assessment / Recommendations / Plan   Plan  Continue with current plan of care      Progression Toward Goals   Progression toward goals  Progressing toward goals       SLP Education - 10/24/18 1114    Education Details  Effect of muscle tension on vocal quality and pitch control  Person(s) Educated  Patient    Methods  Explanation    Comprehension  Verbalized understanding         SLP Long Term Goals - 10/15/18 1545      SLP LONG TERM GOAL #1   Title  The patient will demonstrate independent understanding of vocal hygiene concepts and extrinsic laryngeal muscle stretches.      Time  4    Period  Weeks    Status  New    Target Date  11/15/18      SLP LONG TERM GOAL #2   Title  The patient will be independent for abdominal breathing and breath support exercises.    Time  4    Period  Weeks    Status  New    Target Date  11/15/18      SLP LONG TERM GOAL #3   Title  The patient will  minimize vocal tension via resonant voice therapy (or comparable technique) with min SLP cues with 80% accuracy.    Time  4    Period  Weeks    Status  New    Target Date  11/15/18      SLP LONG TERM GOAL #4   Title  The patient will maximize voice quality and loudness using breath support/oral resonance for paragraph length recitation with 80% accuracy.    Time  4    Period  Weeks    Status  New    Target Date  11/15/18       Plan - 10/24/18 1115    Clinical Impression Statement  Patient able to improve vocal quality with nasality/semi-occluded vocal tract to improve oral resonance and loudness to decrease laryngeal strain.  As a Holiday representativeprofessional singer, the patient has excellent insight.  The patient is gaining control of pitch range and improved speaking vocal quality in resonant voice exercises.  The patient reports improved vocal endurance for performance last night.    Speech Therapy Frequency  2x / week    Duration  4 weeks    Treatment/Interventions  Patient/family education;Other (comment)   Voice therapy   Potential to Achieve Goals  Good    Potential Considerations  Ability to learn/carryover information;Pain level;Family/community support;Co-morbidities;Previous level of function;Cooperation/participation level;Severity of impairments;Medical prognosis    SLP Home Exercise Plan  Provided    Consulted and Agree with Plan of Care  Patient       Patient will benefit from skilled therapeutic intervention in order to improve the following deficits and impairments:   Dysphonia    Problem List Patient Active Problem List   Diagnosis Date Noted  . Shortness of breath 07/07/2017  . Peripheral neuropathy 07/07/2017  . Fatigue 07/07/2017  . Chest pain 07/07/2017  . Fx sacrum/coccyx-closed (HCC) 11/02/2016  . Laceration of labial vestibule 10/25/2016  . History of ITP 10/14/2016  . Atypical migraine 07/13/2016  . Herpes genitalis in women 04/20/2016   Dollene PrimroseSusan G Velinda Wrobel,  MS/CCC- SLP  Leandrew KoyanagiAbernathy, Susie 10/24/2018, 11:17 AM  [facility] [address] [place], [place], [address] Phone: [phone]   Fax:  [phone]   Name: Erin Gomez MRN: [id number] Date of Birth: 11/21/1970

## 2018-10-29 ENCOUNTER — Encounter: Payer: Self-pay | Admitting: Speech Pathology

## 2018-10-29 ENCOUNTER — Ambulatory Visit: Payer: BLUE CROSS/BLUE SHIELD | Admitting: Speech Pathology

## 2018-10-29 DIAGNOSIS — R490 Dysphonia: Secondary | ICD-10-CM

## 2018-10-29 NOTE — Therapy (Signed)
[facility] [address] [place], [place], [address] Phone: [phone]   Fax:  [phone]  Speech Language Pathology Treatment  Patient Details  Name: Erin JordanChristine H Gomez MRN: [id number] Date of Birth: 04/08/1971 Referring Provider (SLP): Meta HatchetBROOKS, JENNIFER ANN    Encounter Date: 10/29/2018  End of Session - 10/29/18 1001    Visit Number  5    Date for SLP Re-Evaluation  11/15/18    SLP Start Time  0911    SLP Stop Time   1000    SLP Time Calculation (min)  49 min    Activity Tolerance  Patient tolerated treatment well       Past Medical History:  Diagnosis Date  . Arthritis   . Dyspnea    occasionally, may be disc related  . Heart palpitations   . History of ITP 10/14/2016  . History of miscarriage, currently pregnant   . Vaginal Pap smear, abnormal     Past Surgical History:  Procedure Laterality Date  . CERVICAL CONE BIOPSY  2008  . DILATION AND EVACUATION N/A 01/16/2018   Procedure: DILATATION AND EVACUATION;  Surgeon: Herold Harmsefrancesco, Martin A, MD;  Location: [facility] ORS;  Service: Gynecology;  Laterality: N/A;  . LEEP    . TONSILLECTOMY    . WISDOM TOOTH EXTRACTION      There were no vitals filed for this visit.  Subjective Assessment - 10/29/18 1000    Subjective  The patient reports improved vocal endurance for singing at an event            ADULT SLP TREATMENT - 10/29/18 0001      General Information   Behavior/Cognition  Alert;Cooperative;Pleasant mood    HPI  47 year old woman with 4 year history of on and off dysphonia referred for voice therapy.  Per ENT report, abnormal findings include posterior VC gap with phonation.   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      Treatment Provided   Treatment provided  Cognitive-Linquistic      Pain Assessment   Pain Assessment  No/denies pain      Cognitive-Linquistic Treatment   Treatment focused on  Voice    Skilled Treatment  The patient was  provided with written and verbal teaching regarding vocal hygiene.  The patient was provided with written and verbal teaching regarding neck, tongue, and throat stretches exercises to promote relaxed phonation. The patient was provided with written and verbal teaching for supplemental vocal tract relaxation exercises (straw phonation, trills).  The patient was provided with written and verbal teaching regarding breath support exercises.  The patient was provided with verbal, written and recorded instruction in resonant voice exercises:  Hum- Sustained, Hum- Siren, hum- Vowels, Hum- Descending glides, Hum- Ascending glides, Hum- word level.  Patient instructed in tongue-out hum modification, semi-occluded vocal tract modification (/w/), and vocal loudness/breath support to decrease vocal strain.  Patient initially with mild-moderately hoarse voice.  Patient able to use stretching and vocal warm-up to improve vocal quality and generate clear vocal quality in conversation with 70% accuracy.      Assessment / Recommendations / Plan   Plan  Continue with current plan of care      Progression Toward Goals   Progression toward goals  Progressing toward goals       SLP Education - 10/29/18 1000    Education Details  Effect of muscle tension on vocal quality and pitch control    Person(s) Educated  Patient  Methods  Explanation    Comprehension  Verbalized understanding         SLP Long Term Goals - 10/15/18 1545      SLP LONG TERM GOAL #1   Title  The patient will demonstrate independent understanding of vocal hygiene concepts and extrinsic laryngeal muscle stretches.      Time  4    Period  Weeks    Status  New    Target Date  11/15/18      SLP LONG TERM GOAL #2   Title  The patient will be independent for abdominal breathing and breath support exercises.    Time  4    Period  Weeks    Status  New    Target Date  11/15/18      SLP LONG TERM GOAL #3   Title  The patient will minimize  vocal tension via resonant voice therapy (or comparable technique) with min SLP cues with 80% accuracy.    Time  4    Period  Weeks    Status  New    Target Date  11/15/18      SLP LONG TERM GOAL #4   Title  The patient will maximize voice quality and loudness using breath support/oral resonance for paragraph length recitation with 80% accuracy.    Time  4    Period  Weeks    Status  New    Target Date  11/15/18       Plan - 10/29/18 1001    Clinical Impression Statement  Patient able to improve vocal quality with nasality/semi-occluded vocal tract to improve oral resonance and loudness to decrease laryngeal strain.  As a [employer] representative, the patient has excellent insight.  The patient is gaining control of pitch range and improved speaking vocal quality in resonant voice exercises.  The patient reports improved vocal endurance for performance this weekend.    Speech Therapy Frequency  2x / week    Duration  4 weeks    Treatment/Interventions  Patient/family education;Other (comment)   Voice therapy   Potential to Achieve Goals  Good    Potential Considerations  Ability to learn/carryover information;Pain level;Family/community support;Co-morbidities;Previous level of function;Cooperation/participation level;Severity of impairments;Medical prognosis    SLP Home Exercise Plan  Provided    Consulted and Agree with Plan of Care  Patient       Patient will benefit from skilled therapeutic intervention in order to improve the following deficits and impairments:   Dysphonia    Problem List Patient Active Problem List   Diagnosis Date Noted  . Shortness of breath 07/07/2017  . Peripheral neuropathy 07/07/2017  . Fatigue 07/07/2017  . Chest pain 07/07/2017  . Fx sacrum/coccyx-closed (HCC) 11/02/2016  . Laceration of labial vestibule 10/25/2016  . History of ITP 10/14/2016  . Atypical migraine 07/13/2016  . Herpes genitalis in women 04/20/2016   Erin Primrose, MS/CCC-  SLP  [name] 10/29/2018, 10:02 AM  [facility] MAIN [facility] SERVICES [address] [place], [place], [address] Phone: [phone]   Fax:  [phone]   Name: THELMA LORENZETTI MRN: [id number] Date of Birth: Aug 30, 1971

## 2018-10-31 ENCOUNTER — Ambulatory Visit: Payer: BLUE CROSS/BLUE SHIELD | Admitting: Speech Pathology

## 2018-10-31 ENCOUNTER — Encounter: Payer: Self-pay | Admitting: Speech Pathology

## 2018-10-31 DIAGNOSIS — R490 Dysphonia: Secondary | ICD-10-CM | POA: Diagnosis not present

## 2018-10-31 NOTE — Therapy (Signed)
[facility] MAIN [facility] SERVICES [address] [place], [place], [address] Phone: [phone]   Fax:  [phone]  Speech Language Pathology Treatment  Patient Details  Name: Erin Gomez MRN: [id number] Date of Birth: [birth year] Referring Provider (SLP): Meta Hatchet ANN    Encounter Date: 10/31/2018  End of Session - 10/31/18 1108    Visit Number  6    Number of Visits  9    Date for SLP Re-Evaluation  11/16/18    SLP Start Time  0910    SLP Stop Time   1000    SLP Time Calculation (min)  50 min    Activity Tolerance  Patient tolerated treatment well       Past Medical History:  Diagnosis Date  . Arthritis   . Dyspnea    occasionally, may be disc related  . Heart palpitations   . History of ITP 10/14/2016  . History of miscarriage, currently pregnant   . Vaginal Pap smear, abnormal     Past Surgical History:  Procedure Laterality Date  . CERVICAL CONE BIOPSY  2008  . DILATION AND EVACUATION N/A 01/16/2018   Procedure: DILATATION AND EVACUATION;  Surgeon: Herold Harms, MD;  Location: [facility] ORS;  Service: Gynecology;  Laterality: N/A;  . LEEP    . TONSILLECTOMY    . WISDOM TOOTH EXTRACTION      There were no vitals filed for this visit.  Subjective Assessment - 10/31/18 1107    Subjective  The patient reports improved vocal endurance for singing at an event            ADULT SLP TREATMENT - 10/31/18 0001      General Information   Behavior/Cognition  Alert;Cooperative;Pleasant mood    HPI  47 year old woman with 4 year history of on and off dysphonia referred for voice therapy.  Per ENT report, abnormal findings include posterior VC gap with phonation.   The patient is a [employer] representative.      Treatment Provided   Treatment provided  Cognitive-Linquistic      Pain Assessment   Pain Assessment  No/denies pain      Cognitive-Linquistic Treatment   Treatment focused on  Voice    Skilled Treatment   The patient was provided with written and verbal teaching regarding vocal hygiene.  The patient was provided with written and verbal teaching regarding neck, tongue, and throat stretches exercises to promote relaxed phonation. The patient was provided with written and verbal teaching for supplemental vocal tract relaxation exercises (straw phonation, trills).  The patient was provided with written and verbal teaching regarding breath support exercises.  The patient was provided with verbal, written and recorded instruction in resonant voice exercises:  Hum- Sustained, Hum- Siren, hum- Vowels, Hum- Descending glides, Hum- Ascending glides, Hum- word level.  Patient instructed in tongue-out hum modification, semi-occluded vocal tract modification (/w/), and vocal loudness/breath support to decrease vocal strain.  Patient initially with mild-moderately hoarse voice.  Patient able to use stretching and vocal warm-up to improve vocal quality and generate clear vocal quality, easy pitch glide/transition with /u/ vowel.  She had difficulty achieving the high pitch and easy transition with any other vowel.  She has been provided with material and instruction in exercises to achieve easy pitch transitions.  The patient identifies lose of diaphragmatic support for sustained phonation.  We discussed a variety of exercises to improve her awareness of abdominal/diaphragmatic support.      Assessment / Recommendations /  Plan   Plan  Continue with current plan of care      Progression Toward Goals   Progression toward goals  Progressing toward goals       SLP Education - 10/31/18 1107    Education Details  Effect of muscle tension on vocal quality and pitch control    Person(s) Educated  Patient    Methods  Explanation    Comprehension  Verbalized understanding         SLP Long Term Goals - 10/15/18 1545      SLP LONG TERM GOAL #1   Title  The patient will demonstrate independent understanding of vocal  hygiene concepts and extrinsic laryngeal muscle stretches.      Time  4    Period  Weeks    Status  New    Target Date  11/15/18      SLP LONG TERM GOAL #2   Title  The patient will be independent for abdominal breathing and breath support exercises.    Time  4    Period  Weeks    Status  New    Target Date  11/15/18      SLP LONG TERM GOAL #3   Title  The patient will minimize vocal tension via resonant voice therapy (or comparable technique) with min SLP cues with 80% accuracy.    Time  4    Period  Weeks    Status  New    Target Date  11/15/18      SLP LONG TERM GOAL #4   Title  The patient will maximize voice quality and loudness using breath support/oral resonance for paragraph length recitation with 80% accuracy.    Time  4    Period  Weeks    Status  New    Target Date  11/15/18       Plan - 10/31/18 1108    Clinical Impression Statement  Patient able to improve vocal quality with nasality/semi-occluded vocal tract to improve oral resonance and loudness to decrease laryngeal strain.  As a [employer] representative, the patient has excellent insight.  The patient is gaining control of pitch range and improved speaking vocal quality in resonant voice exercises.  The patient reports improved vocal endurance for performance this weekend.    Speech Therapy Frequency  2x / week    Duration  4 weeks    Treatment/Interventions  Patient/family education;Other (comment)   Voice therapy   Potential to Achieve Goals  Good    Potential Considerations  Ability to learn/carryover information;Pain level;Family/community support;Co-morbidities;Previous level of function;Cooperation/participation level;Severity of impairments;Medical prognosis    SLP Home Exercise Plan  Provided    Consulted and Agree with Plan of Care  Patient       Patient will benefit from skilled therapeutic intervention in order to improve the following deficits and impairments:   Dysphonia    Problem List Patient  Active Problem List   Diagnosis Date Noted  . Shortness of breath 07/07/2017  . Peripheral neuropathy 07/07/2017  . Fatigue 07/07/2017  . Chest pain 07/07/2017  . Fx sacrum/coccyx-closed (HCC) 11/02/2016  . Laceration of labial vestibule 10/25/2016  . History of ITP 10/14/2016  . Atypical migraine 07/13/2016  . Herpes genitalis in women 04/20/2016   [name], MS/CCC- SLP  [name] 10/31/2018, 11:09 AM  [facility] MAIN [facility] SERVICES [address] [place], [place], [address] Phone: [phone]   Fax:  [phone]   Name: Erin Gomez MRN: [id number] Date of Birth: 06/06/1971

## 2018-11-05 ENCOUNTER — Ambulatory Visit: Payer: BLUE CROSS/BLUE SHIELD | Admitting: Speech Pathology

## 2018-11-05 ENCOUNTER — Encounter: Payer: Self-pay | Admitting: Speech Pathology

## 2018-11-05 DIAGNOSIS — R490 Dysphonia: Secondary | ICD-10-CM | POA: Diagnosis not present

## 2018-11-05 NOTE — Therapy (Signed)
[facility] [address] [place], [place], [address] Phone: [phone]   Fax:  [phone]  Speech Language Pathology Treatment  Patient Details  Name: Erin JordanChristine H Gomez MRN: [id number] Date of Birth: 11/29/1970 Referring Provider (SLP): Meta HatchetBROOKS, JENNIFER ANN    Encounter Date: 11/05/2018  End of Session - 11/05/18 1249    Visit Number  7    Number of Visits  9    Date for SLP Re-Evaluation  11/16/18    SLP Start Time  0905    SLP Stop Time   0955    SLP Time Calculation (min)  50 min    Activity Tolerance  Patient tolerated treatment well       Past Medical History:  Diagnosis Date  . Arthritis   . Dyspnea    occasionally, may be disc related  . Heart palpitations   . History of ITP 10/14/2016  . History of miscarriage, currently pregnant   . Vaginal Pap smear, abnormal     Past Surgical History:  Procedure Laterality Date  . CERVICAL CONE BIOPSY  2008  . DILATION AND EVACUATION N/A 01/16/2018   Procedure: DILATATION AND EVACUATION;  Surgeon: Herold Harmsefrancesco, Martin A, MD;  Location: [facility] ORS;  Service: Gynecology;  Laterality: N/A;  . LEEP    . TONSILLECTOMY    . WISDOM TOOTH EXTRACTION      There were no vitals filed for this visit.  Subjective Assessment - 11/05/18 1248    Subjective  The patient reports improved vocal endurance for singing at events            ADULT SLP TREATMENT - 11/05/18 0001      General Information   Behavior/Cognition  Alert;Cooperative;Pleasant mood    HPI  47 year old woman with 4 year history of on and off dysphonia referred for voice therapy.  Per ENT report, abnormal findings include posterior VC gap with phonation.   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      Treatment Provided   Treatment provided  Cognitive-Linquistic      Pain Assessment   Pain Assessment  No/denies pain      Cognitive-Linquistic Treatment   Treatment focused on  Voice    Skilled Treatment   The patient was provided with written and verbal teaching regarding vocal hygiene.  The patient was provided with written and verbal teaching regarding neck, tongue, and throat stretches exercises to promote relaxed phonation. The patient was provided with written and verbal teaching for supplemental vocal tract relaxation exercises (straw phonation, trills).  The patient was provided with written and verbal teaching regarding breath support exercises.  The patient was provided with verbal, written and recorded instruction in resonant voice exercises:  Hum- Sustained, Hum- Siren, hum- Vowels, Hum- Descending glides, Hum- Ascending glides, Hum- word level.  Patient instructed in tongue-out hum modification, semi-occluded vocal tract modification (/w/), and vocal loudness/breath support to decrease vocal strain.  Patient initially with mild-moderately hoarse voice.  Patient able to use stretching and vocal warm-up to improve vocal quality and generate clear vocal quality, easy pitch glide/transition with /u/ vowel.  She had difficulty achieving the high pitch and easy transition with any other vowel.  She has been provided with material and instruction in exercises to achieve easy pitch transitions.  The patient identifies lose of diaphragmatic support for sustained phonation.  We discussed a variety of exercises to improve her awareness of abdominal/diaphragmatic support.      Assessment / Recommendations /  Plan   Plan  Continue with current plan of care      Progression Toward Goals   Progression toward goals  Progressing toward goals       SLP Education - 11/05/18 1248    Education Details  Effect of muscle tension on vocal quality and pitch control    Person(s) Educated  Patient    Methods  Explanation    Comprehension  Verbalized understanding         SLP Long Term Goals - 10/15/18 1545      SLP LONG TERM GOAL #1   Title  The patient will demonstrate independent understanding of vocal  hygiene concepts and extrinsic laryngeal muscle stretches.      Time  4    Period  Weeks    Status  New    Target Date  11/15/18      SLP LONG TERM GOAL #2   Title  The patient will be independent for abdominal breathing and breath support exercises.    Time  4    Period  Weeks    Status  New    Target Date  11/15/18      SLP LONG TERM GOAL #3   Title  The patient will minimize vocal tension via resonant voice therapy (or comparable technique) with min SLP cues with 80% accuracy.    Time  4    Period  Weeks    Status  New    Target Date  11/15/18      SLP LONG TERM GOAL #4   Title  The patient will maximize voice quality and loudness using breath support/oral resonance for paragraph length recitation with 80% accuracy.    Time  4    Period  Weeks    Status  New    Target Date  11/15/18       Plan - 11/05/18 1249    Clinical Impression Statement  Patient able to improve vocal quality with nasality/semi-occluded vocal tract to improve oral resonance and loudness to decrease laryngeal strain.  As a Holiday representativeprofessional singer, the patient has excellent insight.  The patient is gaining control of pitch range and improved speaking vocal quality in resonant voice exercises.  The patient reports improved vocal endurance for performance this weekend.    Speech Therapy Frequency  2x / week    Duration  4 weeks    Treatment/Interventions  Patient/family education;Other (comment)   Voice therapy   Potential to Achieve Goals  Good    Potential Considerations  Ability to learn/carryover information;Pain level;Family/community support;Co-morbidities;Previous level of function;Cooperation/participation level;Severity of impairments;Medical prognosis    SLP Home Exercise Plan  Provided    Consulted and Agree with Plan of Care  Patient       Patient will benefit from skilled therapeutic intervention in order to improve the following deficits and impairments:   Dysphonia    Problem List Patient  Active Problem List   Diagnosis Date Noted  . Shortness of breath 07/07/2017  . Peripheral neuropathy 07/07/2017  . Fatigue 07/07/2017  . Chest pain 07/07/2017  . Fx sacrum/coccyx-closed (HCC) 11/02/2016  . Laceration of labial vestibule 10/25/2016  . History of ITP 10/14/2016  . Atypical migraine 07/13/2016  . Herpes genitalis in women 04/20/2016   Dollene PrimroseSusan G Jaimes Eckert, MS/CCC- SLP  Leandrew KoyanagiAbernathy, Susie 11/05/2018, 12:50 PM  [facility] MAIN [facility] [address] [place], [place], [address] Phone: [phone]   Fax:  [phone]   Name: Erin CanesChristine  Keane ScrapeH Gomez MRN: [id number] Date of Birth: 06/06/1971

## 2018-11-12 ENCOUNTER — Ambulatory Visit: Payer: BLUE CROSS/BLUE SHIELD | Admitting: Speech Pathology

## 2018-11-15 ENCOUNTER — Ambulatory Visit: Payer: BLUE CROSS/BLUE SHIELD | Attending: Physician Assistant | Admitting: Speech Pathology

## 2019-02-22 ENCOUNTER — Other Ambulatory Visit: Payer: Self-pay | Admitting: Obstetrics and Gynecology

## 2019-07-24 ENCOUNTER — Encounter: Payer: BLUE CROSS/BLUE SHIELD | Admitting: Obstetrics and Gynecology

## 2019-07-31 ENCOUNTER — Other Ambulatory Visit: Payer: Self-pay | Admitting: Obstetrics and Gynecology

## 2019-08-23 ENCOUNTER — Ambulatory Visit (INDEPENDENT_AMBULATORY_CARE_PROVIDER_SITE_OTHER): Payer: BLUE CROSS/BLUE SHIELD | Admitting: Obstetrics and Gynecology

## 2019-08-23 ENCOUNTER — Encounter: Payer: Self-pay | Admitting: Obstetrics and Gynecology

## 2019-08-23 ENCOUNTER — Other Ambulatory Visit: Payer: Self-pay

## 2019-08-23 VITALS — BP 103/71 | HR 63 | Ht 64.5 in | Wt 144.9 lb

## 2019-08-23 DIAGNOSIS — Z1231 Encounter for screening mammogram for malignant neoplasm of breast: Secondary | ICD-10-CM | POA: Diagnosis not present

## 2019-08-23 DIAGNOSIS — Z01419 Encounter for gynecological examination (general) (routine) without abnormal findings: Secondary | ICD-10-CM | POA: Diagnosis not present

## 2019-08-23 NOTE — Patient Instructions (Signed)
Preventive Care 40-48 Years Old, Female Preventive care refers to visits with your health care provider and lifestyle choices that can promote health and wellness. This includes:  A yearly physical exam. This may also be called an annual well check.  Regular dental visits and eye exams.  Immunizations.  Screening for certain conditions.  Healthy lifestyle choices, such as eating a healthy diet, getting regular exercise, not using drugs or products that contain nicotine and tobacco, and limiting alcohol use. What can I expect for my preventive care visit? Physical exam Your health care provider will check your:  Height and weight. This may be used to calculate body mass index (BMI), which tells if you are at a healthy weight.  Heart rate and blood pressure.  Skin for abnormal spots. Counseling Your health care provider may ask you questions about your:  Alcohol, tobacco, and drug use.  Emotional well-being.  Home and relationship well-being.  Sexual activity.  Eating habits.  Work and work environment.  Method of birth control.  Menstrual cycle.  Pregnancy history. What immunizations do I need?  Influenza (flu) vaccine  This is recommended every year. Tetanus, diphtheria, and pertussis (Tdap) vaccine  You may need a Td booster every 10 years. Varicella (chickenpox) vaccine  You may need this if you have not been vaccinated. Zoster (shingles) vaccine  You may need this after age 60. Measles, mumps, and rubella (MMR) vaccine  You may need at least one dose of MMR if you were born in 1957 or later. You may also need a second dose. Pneumococcal conjugate (PCV13) vaccine  You may need this if you have certain conditions and were not previously vaccinated. Pneumococcal polysaccharide (PPSV23) vaccine  You may need one or two doses if you smoke cigarettes or if you have certain conditions. Meningococcal conjugate (MenACWY) vaccine  You may need this if you  have certain conditions. Hepatitis A vaccine  You may need this if you have certain conditions or if you travel or work in places where you may be exposed to hepatitis A. Hepatitis B vaccine  You may need this if you have certain conditions or if you travel or work in places where you may be exposed to hepatitis B. Haemophilus influenzae type b (Hib) vaccine  You may need this if you have certain conditions. Human papillomavirus (HPV) vaccine  If recommended by your health care provider, you may need three doses over 6 months. You may receive vaccines as individual doses or as more than one vaccine together in one shot (combination vaccines). Talk with your health care provider about the risks and benefits of combination vaccines. What tests do I need? Blood tests  Lipid and cholesterol levels. These may be checked every 5 years, or more frequently if you are over 50 years old.  Hepatitis C test.  Hepatitis B test. Screening  Lung cancer screening. You may have this screening every year starting at age 55 if you have a 30-pack-year history of smoking and currently smoke or have quit within the past 15 years.  Colorectal cancer screening. All adults should have this screening starting at age 50 and continuing until age 75. Your health care provider may recommend screening at age 45 if you are at increased risk. You will have tests every 1-10 years, depending on your results and the type of screening test.  Diabetes screening. This is done by checking your blood sugar (glucose) after you have not eaten for a while (fasting). You may have this   done every 1-3 years.  Mammogram. This may be done every 1-2 years. Talk with your health care provider about when you should start having regular mammograms. This may depend on whether you have a family history of breast cancer.  BRCA-related cancer screening. This may be done if you have a family history of breast, ovarian, tubal, or peritoneal  cancers.  Pelvic exam and Pap test. This may be done every 3 years starting at age 23. Starting at age 86, this may be done every 5 years if you have a Pap test in combination with an HPV test. Other tests  Sexually transmitted disease (STD) testing.  Bone density scan. This is done to screen for osteoporosis. You may have this scan if you are at high risk for osteoporosis. Follow these instructions at home: Eating and drinking  Eat a diet that includes fresh fruits and vegetables, whole grains, lean protein, and low-fat dairy.  Take vitamin and mineral supplements as recommended by your health care provider.  Do not drink alcohol if: ? Your health care provider tells you not to drink. ? You are pregnant, may be pregnant, or are planning to become pregnant.  If you drink alcohol: ? Limit how much you have to 0-1 drink a day. ? Be aware of how much alcohol is in your drink. In the [place] one drink equals one 12 oz bottle of beer (355 mL), one 5 oz glass of wine (148 mL), or one 1 oz glass of hard liquor (44 mL). Lifestyle  Take daily care of your teeth and gums.  Stay active. Exercise for at least 30 minutes on 5 or more days each week.  Do not use any products that contain nicotine or tobacco, such as cigarettes, e-cigarettes, and chewing tobacco. If you need help quitting, ask your health care provider.  If you are sexually active, practice safe sex. Use a condom or other form of birth control (contraception) in order to prevent pregnancy and STIs (sexually transmitted infections).  If told by your health care provider, take low-dose aspirin daily starting at age 27. What's next?  Visit your health care provider once a year for a well check visit.  Ask your health care provider how often you should have your eyes and teeth checked.  Stay up to date on all vaccines. This information is not intended to replace advice given to you by your health care provider. Make sure you  discuss any questions you have with your health care provider. Document Released: 11/27/2015 Document Revised: 07/12/2018 Document Reviewed: 07/12/2018 Elsevier Patient Education  2020 [place] self-awareness is knowing how your breasts look and feel. Doing breast self-awareness is important. It allows you to catch a breast problem early while it is still small and can be treated. All women should do breast self-awareness, including women who have had breast implants. Tell your doctor if you notice a change in your breasts. What you need:  A mirror.  A well-lit room. How to do a breast self-exam A breast self-exam is one way to learn what is normal for your breasts and to check for changes. To do a breast self-exam: Look for changes  1. Take off all the clothes above your waist. 2. Stand in front of a mirror in a room with good lighting. 3. Put your hands on your hips. 4. Push your hands down. 5. Look at your breasts and nipples in the mirror to see if one breast or nipple looks  different from the other. Check to see if: ? The shape of one breast is different. ? The size of one breast is different. ? There are wrinkles, dips, and bumps in one breast and not the other. 6. Look at each breast for changes in the skin, such as: ? Redness. ? Scaly areas. 7. Look for changes in your nipples, such as: ? Liquid around the nipples. ? Bleeding. ? Dimpling. ? Redness. ? A change in where the nipples are. Feel for changes  1. Lie on your back on the floor. 2. Feel each breast. To do this, follow these steps: ? Pick a breast to feel. ? Put the arm closest to that breast above your head. ? Use your other arm to feel the nipple area of your breast. Feel the area with the pads of your three middle fingers by making small circles with your fingers. For the first circle, press lightly. For the second circle, press harder. For the third circle, press even harder.  ? Keep making circles with your fingers at the different pressures as you move down your breast. Stop when you feel your ribs. ? Move your fingers a little toward the center of your body. ? Start making circles with your fingers again, this time going up until you reach your collarbone. ? Keep making up-and-down circles until you reach your armpit. Remember to keep using the three pressures. ? Feel the other breast in the same way. 3. Sit or stand in the tub or shower. 4. With soapy water on your skin, feel each breast the same way you did in step 2 when you were lying on the floor. Write down what you find Writing down what you find can help you remember what to tell your doctor. Write down:  What is normal for each breast.  Any changes you find in each breast, including: ? The kind of changes you find. ? Whether you have pain. ? Size and location of any lumps.  When you last had your menstrual period. General tips  Check your breasts every month.  If you are breastfeeding, the best time to check your breasts is after you feed your baby or after you use a breast pump.  If you get menstrual periods, the best time to check your breasts is 5-7 days after your menstrual period is over.  With time, you will become comfortable with the self-exam, and you will begin to know if there are changes in your breasts. Contact a doctor if you:  See a change in the shape or size of your breasts or nipples.  See a change in the skin of your breast or nipples, such as red or scaly skin.  Have fluid coming from your nipples that is not normal.  Find a lump or thick area that was not there before.  Have pain in your breasts.  Have any concerns about your breast health. Summary  Breast self-awareness includes looking for changes in your breasts, as well as feeling for changes within your breasts.  Breast self-awareness should be done in front of a mirror in a well-lit room.  You should  check your breasts every month. If you get menstrual periods, the best time to check your breasts is 5-7 days after your menstrual period is over.  Let your doctor know of any changes you see in your breasts, including changes in size, changes on the skin, pain or tenderness, or fluid from your nipples that is not normal. This  information is not intended to replace advice given to you by your health care provider. Make sure you discuss any questions you have with your health care provider. Document Released: 04/18/2008 Document Revised: 06/19/2018 Document Reviewed: 06/19/2018 Elsevier Patient Education  2020 [employer].

## 2019-08-23 NOTE — Progress Notes (Signed)
Subjective:   Erin Gomez is a 48 y.o. [phone] Caucasian female here for a routine well-woman exam.  Patient's last menstrual period was 08/11/2019.    Current complaints: LLQ cramping for last two months at menses. Better now. PCP: Beryle Flock       does desire labs  Social History: Sexual: heterosexual Marital Status: married Living situation: with family Occupation: singer and band member Tobacco/alcohol: no tobacco use Illicit drugs: no history of illicit drug use  The following portions of the patient's history were reviewed and updated as appropriate: allergies, current medications, past family history, past medical history, past social history, past surgical history and problem list.  Past Medical History Past Medical History:  Diagnosis Date  . Arthritis   . Dyspnea    occasionally, may be disc related  . Heart palpitations   . History of ITP 10/14/2016  . History of miscarriage, currently pregnant   . Vaginal Pap smear, abnormal     Past Surgical History Past Surgical History:  Procedure Laterality Date  . CERVICAL CONE BIOPSY  2008  . DILATION AND EVACUATION N/A 01/16/2018   Procedure: DILATATION AND EVACUATION;  Surgeon: Herold Harms, MD;  Location: [facility] ORS;  Service: Gynecology;  Laterality: N/A;  . LEEP    . TONSILLECTOMY    . WISDOM TOOTH EXTRACTION      Gynecologic History [id number]  Patient's last menstrual period was 08/11/2019. Contraception: OCP (estrogen/progesterone) Last Pap: 2018. Results were: normal Last mammogram: 2017. Results were: normal   Obstetric History OB History  Gravida Para Term Preterm AB Living  10 1 1   8 1   SAB TAB Ectopic Multiple Live Births  8     0 1    # Outcome Date GA Lbr Len/2nd Weight Sex Delivery Anes PTL Lv  10 Gravida           9 Term 10/14/16 [redacted]w[redacted]d / 02:06 8 lb 12 oz (3.97 kg) F Vag-Spont None  LIV  8 SAB           7 SAB           6 SAB           5 SAB           4 SAB           3 SAB            2 SAB           1 SAB             Current Medications Current Outpatient Medications on File Prior to Visit  Medication Sig Dispense Refill  . acyclovir (ZOVIRAX) 400 MG tablet TAKE 1 TABLET BY MOUTH TWICE A DAY 60 tablet 4  . meloxicam (MOBIC) 15 MG tablet Take 15 mg by mouth daily.    [redacted]w[redacted]d amoxicillin-clavulanate (AUGMENTIN) 875-125 MG tablet Take 1 tablet by mouth 2 (two) times daily. (Patient not taking: Reported on 08/23/2019) 20 tablet 0  . NON FORMULARY Take 15 mg by mouth at bedtime. CBD gummy at bedtime.    . Norgestimate-Ethinyl Estradiol Triphasic 0.18/0.215/0.25 MG-25 MCG tab Take 1 tablet by mouth daily. (Patient not taking: Reported on 08/23/2019) 3 Package 4  . omeprazole (PRILOSEC) 20 MG capsule   0   No current facility-administered medications on file prior to visit.     Review of Systems Patient denies any headaches, blurred vision, shortness of breath, chest pain, abdominal pain, problems with bowel movements, urination, or  intercourse.  Objective:  BP 103/71   Pulse 63   Ht 5' 4.5" (1.638 m)   Wt 144 lb 14.4 oz (65.7 kg)   LMP 08/11/2019   BMI 24.49 kg/m  Physical Exam  General:  Well developed, well nourished, no acute distress. She is alert and oriented x3. Skin:  Warm and dry Neck:  Midline trachea, no thyromegaly or nodules Cardiovascular: Regular rate and rhythm, no murmur heard Lungs:  Effort normal, all lung fields clear to auscultation bilaterally Breasts:  No dominant palpable mass, retraction, or nipple discharge Abdomen:  Soft, non tender, no hepatosplenomegaly or masses Pelvic:  External genitalia is normal in appearance.  The vagina is normal in appearance. The cervix is bulbous, no CMT.  Thin prep pap is done with HR HPV cotesting. Uterus is felt to be normal size, shape, and contour.  No adnexal masses or tenderness noted.  Extremities:  No swelling or varicosities noted Psych:  She has a normal mood and affect  Assessment:   Healthy  well-woman exam  Plan:  Labs obtained-will follow up accordingly Will continue with current meds F/U 1 year for AE, or sooner if needed Mammogram ordered  Rockney Ghee, CNM

## 2019-08-23 NOTE — Progress Notes (Signed)
Pt present for annual exam. Pt's LMP 08/11/19, last pap 03/23/17. Pt stated that she was doing well no problems.  Pt had flu vaccine 08/22/19.

## 2019-08-24 LAB — COMPREHENSIVE METABOLIC PANEL
ALT: 11 IU/L (ref 0–32)
AST: 13 IU/L (ref 0–40)
Albumin/Globulin Ratio: 1.9 (ref 1.2–2.2)
Albumin: 4.2 g/dL (ref 3.8–4.8)
Alkaline Phosphatase: 43 IU/L (ref 39–117)
BUN/Creatinine Ratio: 19 (ref 9–23)
BUN: 13 mg/dL (ref 6–24)
Bilirubin Total: 0.5 mg/dL (ref 0.0–1.2)
CO2: 26 mmol/L (ref 20–29)
Calcium: 9.4 mg/dL (ref 8.7–10.2)
Chloride: 106 mmol/L (ref 96–106)
Creatinine, Ser: 0.67 mg/dL (ref 0.57–1.00)
GFR calc Af Amer: 120 mL/min/{1.73_m2} (ref >59)
GFR calc non Af Amer: 104 mL/min/{1.73_m2} (ref >59)
Globulin, Total: 2.2 g/dL (ref 1.5–4.5)
Glucose: 87 mg/dL (ref 65–99)
Potassium: 4.5 mmol/L (ref 3.5–5.2)
Sodium: 143 mmol/L (ref 134–144)
Total Protein: 6.4 g/dL (ref 6.0–8.5)

## 2019-08-24 LAB — THYROID PANEL WITH TSH
Free Thyroxine Index: 1.6 (ref 1.2–4.9)
T3 Uptake Ratio: 27 % (ref 24–39)
T4, Total: 6.1 ug/dL (ref 4.5–12.0)
TSH: 1.61 u[IU]/mL (ref 0.450–4.500)

## 2019-08-24 LAB — CBC
Hematocrit: 39.7 % (ref 34.0–46.6)
Hemoglobin: 13.1 g/dL (ref 11.1–15.9)
MCH: 30 pg (ref 26.6–33.0)
MCHC: 33 g/dL (ref 31.5–35.7)
MCV: 91 fL (ref 79–97)
Platelets: 203 10*3/uL (ref 150–450)
RBC: 4.37 x10E6/uL (ref 3.77–5.28)
RDW: 12.1 % (ref 11.7–15.4)
WBC: 5.7 10*3/uL (ref 3.4–10.8)

## 2019-08-24 LAB — HEMOGLOBIN A1C
Est. average glucose Bld gHb Est-mCnc: 100 mg/dL
Hgb A1c MFr Bld: 5.1 % (ref 4.8–5.6)

## 2019-08-24 LAB — LIPID PANEL
Chol/HDL Ratio: 2.8 ratio (ref 0.0–4.4)
Cholesterol, Total: 184 mg/dL (ref 100–199)
HDL: 66 mg/dL (ref >39)
LDL Chol Calc (NIH): 105 mg/dL — ABNORMAL HIGH (ref 0–99)
Triglycerides: 69 mg/dL (ref 0–149)
VLDL Cholesterol Cal: 13 mg/dL (ref 5–40)

## 2019-09-06 LAB — CYTOLOGY - PAP
Comment: NEGATIVE
Comment: NEGATIVE
Comment: NEGATIVE
Diagnosis: NEGATIVE
HPV 16: NEGATIVE
HPV 18 / 45: NEGATIVE
High risk HPV: POSITIVE — AB

## 2019-09-12 ENCOUNTER — Telehealth: Payer: Self-pay | Admitting: Obstetrics and Gynecology

## 2019-09-12 NOTE — Telephone Encounter (Signed)
See 10/29 result note for response.

## 2019-09-12 NOTE — Telephone Encounter (Signed)
Pt stated she missed a call from [facility] and requesting a call back. Please advise.

## 2020-03-10 ENCOUNTER — Other Ambulatory Visit: Payer: Self-pay | Admitting: Obstetrics and Gynecology

## 2020-03-10 DIAGNOSIS — Z1231 Encounter for screening mammogram for malignant neoplasm of breast: Secondary | ICD-10-CM

## 2020-04-22 ENCOUNTER — Ambulatory Visit
Admission: RE | Admit: 2020-04-22 | Discharge: 2020-04-22 | Disposition: A | Discharge status: Home / Self Care | Payer: 59 | Source: Ambulatory Visit | Attending: Obstetrics and Gynecology | Admitting: Obstetrics and Gynecology

## 2020-04-22 DIAGNOSIS — Z1231 Encounter for screening mammogram for malignant neoplasm of breast: Secondary | ICD-10-CM | POA: Diagnosis not present

## 2020-05-04 ENCOUNTER — Inpatient Hospital Stay
Admission: RE | Admit: 2020-05-04 | Discharge: 2020-05-04 | Disposition: A | Discharge status: Home / Self Care | Payer: Self-pay | Source: Ambulatory Visit | Attending: *Deleted | Admitting: *Deleted

## 2020-05-04 ENCOUNTER — Other Ambulatory Visit: Payer: Self-pay | Admitting: *Deleted

## 2020-05-04 DIAGNOSIS — Z1231 Encounter for screening mammogram for malignant neoplasm of breast: Secondary | ICD-10-CM

## 2020-05-25 ENCOUNTER — Ambulatory Visit: Payer: 59 | Admitting: Family Medicine

## 2020-05-25 ENCOUNTER — Other Ambulatory Visit: Payer: Self-pay

## 2020-05-25 ENCOUNTER — Encounter: Payer: Self-pay | Admitting: Family Medicine

## 2020-05-25 VITALS — BP 98/60 | HR 72 | Temp 97.8°F | Ht 64.25 in | Wt 155.2 lb

## 2020-05-25 DIAGNOSIS — M1990 Unspecified osteoarthritis, unspecified site: Secondary | ICD-10-CM | POA: Diagnosis not present

## 2020-05-25 DIAGNOSIS — R1011 Right upper quadrant pain: Secondary | ICD-10-CM | POA: Diagnosis not present

## 2020-05-25 DIAGNOSIS — A6009 Herpesviral infection of other urogenital tract: Secondary | ICD-10-CM

## 2020-05-25 NOTE — Progress Notes (Signed)
Subjective:     Erin Gomez is a 49 y.o. female presenting for Establish Care and Hernia (right upper quadrant )     HPI   #RUQ pain - something comes out once in a while - is doing back bends with yoga - will push it back in - does yoga - thinks she jerked once - is getting pain - worse with intercourse  Naproxen BID for joint pain Has tried tumeric   Review of Systems   Social History   Tobacco Use  Smoking Status Never Smoker  Smokeless Tobacco Never Used        Objective:    BP Readings from Last 3 Encounters:  05/25/20 98/60  08/23/19 103/71  10/08/18 100/60   Wt Readings from Last 3 Encounters:  05/25/20 155 lb 4 oz (70.4 kg)  08/23/19 144 lb 14.4 oz (65.7 kg)  10/08/18 146 lb 3.2 oz (66.3 kg)    BP 98/60   Pulse 72   Temp 97.8 F (36.6 C) (Temporal)   Ht 5' 4.25" (1.632 m)   Wt 155 lb 4 oz (70.4 kg)   LMP 05/07/2020 (Exact Date)   SpO2 98%   Breastfeeding No   BMI 26.44 kg/m    Physical Exam Constitutional:      General: She is not in acute distress.    Appearance: She is well-developed. She is not diaphoretic.  HENT:     Right Ear: External ear normal.     Left Ear: External ear normal.     Nose: Nose normal.  Eyes:     Conjunctiva/sclera: Conjunctivae normal.  Cardiovascular:     Rate and Rhythm: Normal rate and regular rhythm.  Pulmonary:     Effort: Pulmonary effort is normal.  Abdominal:     General: Abdomen is flat. Bowel sounds are normal. There is no distension.     Palpations: Abdomen is soft.     Tenderness: There is no abdominal tenderness. There is no guarding or rebound.     Comments: Area of concern (just below the rib cage in the RUQ) feels full, but unable to appreciate a deficit or bulge   Musculoskeletal:     Cervical back: Neck supple.  Skin:    General: Skin is warm and dry.     Capillary Refill: Capillary refill takes less than 2 seconds.  Neurological:     Mental Status: She is alert. Mental  status is at baseline.  Psychiatric:        Mood and Affect: Mood normal.        Behavior: Behavior normal.           Assessment & Plan:   Problem List Items Addressed This Visit      Musculoskeletal and Integument   Chronic arthritis    Encouraged trial of tumeric and voltaren gel and limiting Naproxen to prn use as much as able.       Relevant Medications   naproxen sodium (ANAPROX) 550 MG tablet     Genitourinary   Herpes genitalis in women    Takes famciclovir for suppression      Relevant Medications   famciclovir (FAMVIR) 500 MG tablet     Other   RUQ pain - Primary    Etiology unclear. Pt concerned about hernia but unable to appreciate on exam. Wonder about referred pain or symptoms. [place] to further evaluate and encouraged PT as an option to assess/treat prior to surgery      Relevant  Orders   US Abdomen Limited RUQ       Return if symptoms worsen or fail to improve.  Lynnda Child, MD  This visit occurred during the SARS-CoV-2 public health emergency.  Safety protocols were in place, including screening questions prior to the visit, additional usage of staff PPE, and extensive cleaning of exam room while observing appropriate contact time as indicated for disinfecting solutions.

## 2020-05-25 NOTE — Assessment & Plan Note (Signed)
Encouraged trial of tumeric and voltaren gel and limiting Naproxen to prn use as much as able.

## 2020-05-25 NOTE — Assessment & Plan Note (Signed)
Etiology unclear. Pt concerned about hernia but unable to appreciate on exam. Wonder about referred pain or symptoms. [place] to further evaluate and encouraged PT as an option to assess/treat prior to surgery

## 2020-05-25 NOTE — Patient Instructions (Addendum)
Curcumin or Tumeric  Research has shown that Curcumin (Tumeric) supplements are just as good as high dose anti-inflammatory medications (like diclofenac and ibuprofen) at treating pain from osteoarthritis without the side effects.   Take Curcumin 500 mg Three times daily   -- You can find a bottle at [employer] for $8 which should last a month -- [place] Brand is around $9 and is available at [facility]   Also try Voltaren Gel   Goal is to decrease regular naproxen use  Ultrasound for you hernia concern  Think about if you want to try PT and let me know

## 2020-05-25 NOTE — Assessment & Plan Note (Signed)
Takes famciclovir for suppression

## 2020-06-12 ENCOUNTER — Ambulatory Visit
Admission: RE | Admit: 2020-06-12 | Discharge: 2020-06-12 | Disposition: A | Discharge status: Home / Self Care | Payer: 59 | Source: Ambulatory Visit | Attending: Family Medicine | Admitting: Family Medicine

## 2020-06-12 DIAGNOSIS — R1011 Right upper quadrant pain: Secondary | ICD-10-CM

## 2020-06-22 ENCOUNTER — Other Ambulatory Visit: Payer: Self-pay | Admitting: Family Medicine

## 2020-06-22 DIAGNOSIS — R1011 Right upper quadrant pain: Secondary | ICD-10-CM

## 2020-06-22 NOTE — Progress Notes (Signed)
Placing order for PT for patient for persistent abdominal pain w/ small hernia. She can keep scheduled appointment or she can reschedule to a few weeks after PT if symptoms not improving to discuss options if no response to PT.

## 2020-06-23 ENCOUNTER — Telehealth: Payer: Self-pay

## 2020-06-23 NOTE — Telephone Encounter (Signed)
Left pt VM (DPR) relaying info, per Dr. Selena Batten instructions below. Instructed pt to call our office if she would like to cancel upcoming appt.   Placing order for PT for patient for persistent abdominal pain w/ small hernia. She can keep scheduled appointment or she can reschedule to a few weeks after PT if symptoms not improving to discuss options if no response to PT.

## 2020-06-23 NOTE — Progress Notes (Signed)
Left pt VM (DPR) relaying info, pr Dr. Selena Batten. Instructed pt to call our office if she would like to cancel upcoming appt.

## 2020-07-02 ENCOUNTER — Other Ambulatory Visit: Payer: Self-pay

## 2020-07-02 ENCOUNTER — Ambulatory Visit: Payer: 59 | Admitting: Family Medicine

## 2020-07-02 ENCOUNTER — Encounter: Payer: Self-pay | Admitting: Family Medicine

## 2020-07-02 VITALS — BP 100/70 | HR 76 | Temp 97.7°F | Ht 64.0 in | Wt 155.0 lb

## 2020-07-02 DIAGNOSIS — K469 Unspecified abdominal hernia without obstruction or gangrene: Secondary | ICD-10-CM | POA: Diagnosis not present

## 2020-07-02 DIAGNOSIS — R1011 Right upper quadrant pain: Secondary | ICD-10-CM | POA: Diagnosis not present

## 2020-07-02 DIAGNOSIS — M1990 Unspecified osteoarthritis, unspecified site: Secondary | ICD-10-CM | POA: Diagnosis not present

## 2020-07-02 NOTE — Assessment & Plan Note (Signed)
Encouraged trial of tumeric or considering whole 30 diet to look for inflammation causing food triggers.

## 2020-07-02 NOTE — Patient Instructions (Addendum)
Curcumin or Tumeric  Research has shown that Curcumin (Tumeric) supplements are just as good as high dose anti-inflammatory medications (like diclofenac and ibuprofen) at treating pain from osteoarthritis without the side effects.   Take Curcumin 500 mg Three times daily   -- You can find a bottle at [employer] for $8 which should last a month -- [place] Brand is around $9 and is available at [facility]   Could start the tumeric - and could continue the naproxen for the first few days    Whole 30 diet  - can look up information online  - anti-inflammatory diet

## 2020-07-02 NOTE — Assessment & Plan Note (Signed)
She will start PT and consider acupuncture if symptoms not improving. Return prn.

## 2020-07-02 NOTE — Progress Notes (Signed)
   Subjective:     Erin Gomez is a 49 y.o. female presenting for Follow-up (hernia)     HPI  #Hernia - planning to start PT with [facility] - continues to have pain in the area whenever she does core work things get worse - worse with yoga - has also seen an integrative provider and is taking some   Chronic pain - saw an integrative provider  - slightly positive for lyme disease - diagnosed with chronic pneumonia (Chylmida  - taking B vitamin and Vit D   Review of Systems   Social History   Tobacco Use  Smoking Status Never Smoker  Smokeless Tobacco Never Used        Objective:    BP Readings from Last 3 Encounters:  07/02/20 100/70  05/25/20 98/60  08/23/19 103/71   Wt Readings from Last 3 Encounters:  07/02/20 155 lb (70.3 kg)  05/25/20 155 lb 4 oz (70.4 kg)  08/23/19 144 lb 14.4 oz (65.7 kg)    BP 100/70   Pulse 76   Temp 97.7 F (36.5 C) (Temporal)   Ht 5\' 4"  (1.626 m)   Wt 155 lb (70.3 kg)   LMP 07/02/2020 (Exact Date)   SpO2 97%   BMI 26.61 kg/m    Physical Exam Constitutional:      General: She is not in acute distress.    Appearance: She is well-developed. She is not diaphoretic.  HENT:     Right Ear: External ear normal.     Left Ear: External ear normal.  Eyes:     Conjunctiva/sclera: Conjunctivae normal.  Cardiovascular:     Rate and Rhythm: Normal rate.  Pulmonary:     Effort: Pulmonary effort is normal.  Musculoskeletal:     Cervical back: Neck supple.  Skin:    General: Skin is warm and dry.     Capillary Refill: Capillary refill takes less than 2 seconds.  Neurological:     Mental Status: She is alert. Mental status is at baseline.  Psychiatric:        Mood and Affect: Mood normal.        Behavior: Behavior normal.           Assessment & Plan:   Problem List Items Addressed This Visit      Musculoskeletal and Integument   Chronic arthritis    Encouraged trial of tumeric or considering whole 30 diet to  look for inflammation causing food triggers.         Other   RUQ pain - Primary   Abdominal hernia without obstruction and without gangrene    She will start PT and consider acupuncture if symptoms not improving. Return prn.           Return if symptoms worsen or fail to improve.  07/04/2020, MD  This visit occurred during the SARS-CoV-2 public health emergency.  Safety protocols were in place, including screening questions prior to the visit, additional usage of staff PPE, and extensive cleaning of exam room while observing appropriate contact time as indicated for disinfecting solutions.

## 2021-04-01 ENCOUNTER — Ambulatory Visit (INDEPENDENT_AMBULATORY_CARE_PROVIDER_SITE_OTHER): Payer: 59 | Admitting: Family Medicine

## 2021-04-01 ENCOUNTER — Encounter: Payer: Self-pay | Admitting: Family Medicine

## 2021-04-01 ENCOUNTER — Other Ambulatory Visit: Payer: Self-pay

## 2021-04-01 VITALS — BP 118/70 | HR 81 | Temp 98.3°F | Wt 149.5 lb

## 2021-04-01 DIAGNOSIS — J0110 Acute frontal sinusitis, unspecified: Secondary | ICD-10-CM | POA: Diagnosis not present

## 2021-04-01 DIAGNOSIS — Z1211 Encounter for screening for malignant neoplasm of colon: Secondary | ICD-10-CM

## 2021-04-01 MED ORDER — AMOXICILLIN-POT CLAVULANATE 875-125 MG PO TABS: 1.0000 | ORAL_TABLET | Freq: Two times a day (BID) | ORAL | 0 refills | Status: AC

## 2021-04-01 NOTE — Patient Instructions (Signed)
Continue the medication to help with symptoms - neti pot - antibiotics - consider flonase during allergy season

## 2021-04-01 NOTE — Progress Notes (Signed)
Subjective:     Erin Gomez is a 50 y.o. female presenting for Nasal Congestion (X 1 month ), Cough (X 3 days ), and Ear Pain (Both ears x 3 days )     Cough Associated symptoms include ear pain and headaches. Pertinent negatives include no sore throat or shortness of breath.  Sinusitis This is a new problem. The current episode started 1 to 4 weeks ago. The problem has been gradually worsening since onset. Associated symptoms include congestion, coughing, ear pain, headaches, a hoarse voice, sinus pressure and sneezing. Pertinent negatives include no shortness of breath or sore throat. Past treatments include saline sprays (steamy shower, local honey).   Negative covid test  Review of Systems  HENT: Positive for congestion, ear pain, hoarse voice, sinus pressure and sneezing. Negative for sore throat.   Respiratory: Positive for cough. Negative for shortness of breath.   Neurological: Positive for headaches.     Social History   Tobacco Use  Smoking Status Never Smoker  Smokeless Tobacco Never Used        Objective:    BP Readings from Last 3 Encounters:  04/01/21 118/70  07/02/20 100/70  05/25/20 98/60   Wt Readings from Last 3 Encounters:  04/01/21 149 lb 8 oz (67.8 kg)  07/02/20 155 lb (70.3 kg)  05/25/20 155 lb 4 oz (70.4 kg)    BP 118/70   Pulse 81   Temp 98.3 F (36.8 C) (Temporal)   Wt 149 lb 8 oz (67.8 kg)   LMP 03/20/2021 (Exact Date)   SpO2 98%   BMI 25.66 kg/m    Physical Exam Constitutional:      General: She is not in acute distress.    Appearance: She is well-developed. She is not diaphoretic.  HENT:     Head: Normocephalic and atraumatic.     Right Ear: Tympanic membrane and ear canal normal.     Left Ear: Tympanic membrane and ear canal normal.     Nose: Mucosal edema and rhinorrhea present.     Right Sinus: No maxillary sinus tenderness or frontal sinus tenderness.     Left Sinus: No maxillary sinus tenderness or frontal  sinus tenderness.     Mouth/Throat:     Pharynx: Uvula midline. No oropharyngeal exudate or posterior oropharyngeal erythema.     Tonsils: 0 on the right. 0 on the left.  Eyes:     General: No scleral icterus.    Conjunctiva/sclera: Conjunctivae normal.  Cardiovascular:     Rate and Rhythm: Normal rate and regular rhythm.     Heart sounds: Normal heart sounds. No murmur heard.   Pulmonary:     Effort: Pulmonary effort is normal. No respiratory distress.     Breath sounds: Normal breath sounds.  Musculoskeletal:     Cervical back: Neck supple.  Lymphadenopathy:     Cervical: No cervical adenopathy.  Skin:    General: Skin is warm and dry.     Capillary Refill: Capillary refill takes less than 2 seconds.  Neurological:     Mental Status: She is alert.           Assessment & Plan:   Problem List Items Addressed This Visit   None   Visit Diagnoses    Acute non-recurrent frontal sinusitis    -  Primary   Relevant Medications   phenylephrine (SUDAFED PE) 10 MG TABS tablet   amoxicillin-clavulanate (AUGMENTIN) 875-125 MG tablet   Screening for colon cancer  Relevant Orders   Fecal occult blood, imunochemical     Symptoms consistent with sinus infection. Treat with abx. Return if not improving.    Return if symptoms worsen or fail to improve.  Lynnda Child, MD  This visit occurred during the SARS-CoV-2 public health emergency.  Safety protocols were in place, including screening questions prior to the visit, additional usage of staff PPE, and extensive cleaning of exam room while observing appropriate contact time as indicated for disinfecting solutions.

## 2021-08-20 ENCOUNTER — Other Ambulatory Visit: Payer: Self-pay | Admitting: Obstetrics and Gynecology

## 2021-08-20 DIAGNOSIS — Z1231 Encounter for screening mammogram for malignant neoplasm of breast: Secondary | ICD-10-CM

## 2021-09-08 ENCOUNTER — Other Ambulatory Visit: Payer: Self-pay

## 2021-09-08 ENCOUNTER — Ambulatory Visit
Admission: RE | Admit: 2021-09-08 | Discharge: 2021-09-08 | Disposition: A | Discharge status: Home / Self Care | Payer: 59 | Source: Ambulatory Visit | Attending: Obstetrics and Gynecology | Admitting: Obstetrics and Gynecology

## 2021-09-08 DIAGNOSIS — Z1231 Encounter for screening mammogram for malignant neoplasm of breast: Secondary | ICD-10-CM

## 2021-09-14 ENCOUNTER — Other Ambulatory Visit: Payer: Self-pay | Admitting: Obstetrics and Gynecology

## 2021-09-14 DIAGNOSIS — R928 Other abnormal and inconclusive findings on diagnostic imaging of breast: Secondary | ICD-10-CM

## 2021-09-14 DIAGNOSIS — N6489 Other specified disorders of breast: Secondary | ICD-10-CM

## 2021-09-17 ENCOUNTER — Ambulatory Visit
Admission: RE | Admit: 2021-09-17 | Discharge: 2021-09-17 | Disposition: A | Discharge status: Home / Self Care | Payer: 59 | Source: Ambulatory Visit | Attending: Obstetrics and Gynecology | Admitting: Obstetrics and Gynecology

## 2021-09-17 ENCOUNTER — Other Ambulatory Visit: Payer: Self-pay

## 2021-09-17 DIAGNOSIS — R928 Other abnormal and inconclusive findings on diagnostic imaging of breast: Secondary | ICD-10-CM

## 2021-09-17 DIAGNOSIS — N6489 Other specified disorders of breast: Secondary | ICD-10-CM

## 2022-01-11 DIAGNOSIS — N912 Amenorrhea, unspecified: Secondary | ICD-10-CM | POA: Diagnosis not present

## 2022-01-28 DIAGNOSIS — J0110 Acute frontal sinusitis, unspecified: Secondary | ICD-10-CM | POA: Diagnosis not present

## 2022-02-03 ENCOUNTER — Ambulatory Visit: Payer: Self-pay | Admitting: Family Medicine

## 2022-03-11 DIAGNOSIS — D2272 Melanocytic nevi of left lower limb, including hip: Secondary | ICD-10-CM | POA: Diagnosis not present

## 2022-03-11 DIAGNOSIS — D2261 Melanocytic nevi of right upper limb, including shoulder: Secondary | ICD-10-CM | POA: Diagnosis not present

## 2022-03-11 DIAGNOSIS — D225 Melanocytic nevi of trunk: Secondary | ICD-10-CM | POA: Diagnosis not present

## 2022-03-11 DIAGNOSIS — D2262 Melanocytic nevi of left upper limb, including shoulder: Secondary | ICD-10-CM | POA: Diagnosis not present

## 2022-03-11 DIAGNOSIS — D2271 Melanocytic nevi of right lower limb, including hip: Secondary | ICD-10-CM | POA: Diagnosis not present

## 2022-03-11 DIAGNOSIS — L814 Other melanin hyperpigmentation: Secondary | ICD-10-CM | POA: Diagnosis not present

## 2022-03-18 DIAGNOSIS — D485 Neoplasm of uncertain behavior of skin: Secondary | ICD-10-CM | POA: Diagnosis not present

## 2022-03-18 DIAGNOSIS — L538 Other specified erythematous conditions: Secondary | ICD-10-CM | POA: Diagnosis not present

## 2022-08-01 DIAGNOSIS — R4189 Other symptoms and signs involving cognitive functions and awareness: Secondary | ICD-10-CM | POA: Diagnosis not present

## 2022-08-01 DIAGNOSIS — R413 Other amnesia: Secondary | ICD-10-CM | POA: Diagnosis not present

## 2022-08-01 DIAGNOSIS — N951 Menopausal and female climacteric states: Secondary | ICD-10-CM | POA: Diagnosis not present

## 2022-08-04 ENCOUNTER — Other Ambulatory Visit: Payer: Self-pay | Admitting: Obstetrics and Gynecology

## 2022-08-04 DIAGNOSIS — Z1231 Encounter for screening mammogram for malignant neoplasm of breast: Secondary | ICD-10-CM

## 2022-10-26 DIAGNOSIS — J0140 Acute pansinusitis, unspecified: Secondary | ICD-10-CM | POA: Diagnosis not present

## 2022-10-26 DIAGNOSIS — Z6825 Body mass index (BMI) 25.0-25.9, adult: Secondary | ICD-10-CM | POA: Diagnosis not present

## 2022-12-26 DIAGNOSIS — S300XXA Contusion of lower back and pelvis, initial encounter: Secondary | ICD-10-CM | POA: Diagnosis not present

## 2023-01-09 DIAGNOSIS — S300XXA Contusion of lower back and pelvis, initial encounter: Secondary | ICD-10-CM | POA: Diagnosis not present

## 2023-01-26 DIAGNOSIS — J0121 Acute recurrent ethmoidal sinusitis: Secondary | ICD-10-CM | POA: Diagnosis not present

## 2023-01-26 DIAGNOSIS — R197 Diarrhea, unspecified: Secondary | ICD-10-CM | POA: Diagnosis not present

## 2023-06-02 DIAGNOSIS — M2550 Pain in unspecified joint: Secondary | ICD-10-CM | POA: Diagnosis not present

## 2023-06-02 DIAGNOSIS — N951 Menopausal and female climacteric states: Secondary | ICD-10-CM | POA: Diagnosis not present

## 2023-06-02 DIAGNOSIS — E559 Vitamin D deficiency, unspecified: Secondary | ICD-10-CM | POA: Diagnosis not present

## 2023-06-02 DIAGNOSIS — R4189 Other symptoms and signs involving cognitive functions and awareness: Secondary | ICD-10-CM | POA: Diagnosis not present

## 2023-06-02 DIAGNOSIS — R232 Flushing: Secondary | ICD-10-CM | POA: Diagnosis not present

## 2023-06-02 DIAGNOSIS — Z77120 Contact with and (suspected) exposure to mold (toxic): Secondary | ICD-10-CM | POA: Diagnosis not present

## 2023-06-02 DIAGNOSIS — R413 Other amnesia: Secondary | ICD-10-CM | POA: Diagnosis not present

## 2023-06-02 DIAGNOSIS — A6920 Lyme disease, unspecified: Secondary | ICD-10-CM | POA: Diagnosis not present

## 2023-06-02 DIAGNOSIS — E538 Deficiency of other specified B group vitamins: Secondary | ICD-10-CM | POA: Diagnosis not present

## 2023-06-02 DIAGNOSIS — N898 Other specified noninflammatory disorders of vagina: Secondary | ICD-10-CM | POA: Diagnosis not present
